# Patient Record
Sex: Male | Born: 1970 | Race: White | Hispanic: No | Marital: Single | Smoking: Never smoker
Health system: Southern US, Community
[De-identification: ages and names within clinical notes are randomized; demographics above are authoritative.]

## PROBLEM LIST (undated history)

## (undated) DIAGNOSIS — K5190 Ulcerative colitis, unspecified, without complications: Secondary | ICD-10-CM

## (undated) HISTORY — PX: CHOLECYSTECTOMY: SHX55

## (undated) HISTORY — DX: Ulcerative colitis, unspecified, without complications: K51.90

## (undated) HISTORY — PX: APPENDECTOMY: SHX54

---

## 2011-09-26 ENCOUNTER — Emergency Department (HOSPITAL_COMMUNITY): Payer: Self-pay

## 2011-09-26 ENCOUNTER — Encounter (HOSPITAL_COMMUNITY): Payer: Self-pay | Admitting: Emergency Medicine

## 2011-09-26 ENCOUNTER — Emergency Department (HOSPITAL_COMMUNITY)
Admission: EM | Admit: 2011-09-26 | Discharge: 2011-09-26 | Disposition: A | Discharge status: Home / Self Care | Payer: Self-pay | Attending: Emergency Medicine | Admitting: Emergency Medicine

## 2011-09-26 DIAGNOSIS — M545 Low back pain, unspecified: Secondary | ICD-10-CM | POA: Insufficient documentation

## 2011-09-26 DIAGNOSIS — IMO0002 Reserved for concepts with insufficient information to code with codable children: Secondary | ICD-10-CM | POA: Insufficient documentation

## 2011-09-26 MED ORDER — OXYCODONE-ACETAMINOPHEN 5-325 MG PO TABS: 2.0000 | ORAL_TABLET | ORAL | Status: DC | PRN

## 2011-09-26 MED ORDER — LORAZEPAM 2 MG/ML IJ SOLN
1.0000 mg | Freq: Once | INTRAMUSCULAR | Status: AC
  Administered 2011-09-26: 1 mg via INTRAVENOUS
  Filled 2011-09-26: qty 1

## 2011-09-26 MED ORDER — PREDNISONE 10 MG PO TABS: 20.0000 mg | ORAL_TABLET | Freq: Every day | ORAL | Status: DC

## 2011-09-26 MED ORDER — METHOCARBAMOL 750 MG PO TABS: 750.0000 mg | ORAL_TABLET | Freq: Four times a day (QID) | ORAL | Status: DC

## 2011-09-26 MED ORDER — DEXAMETHASONE SODIUM PHOSPHATE 10 MG/ML IJ SOLN
10.0000 mg | Freq: Once | INTRAMUSCULAR | Status: AC
  Administered 2011-09-26: 10 mg via INTRAVENOUS
  Filled 2011-09-26: qty 1

## 2011-09-26 MED ORDER — HYDROMORPHONE HCL PF 1 MG/ML IJ SOLN
1.0000 mg | Freq: Once | INTRAMUSCULAR | Status: AC
  Administered 2011-09-26: 1 mg via INTRAVENOUS
  Filled 2011-09-26: qty 1

## 2011-09-26 NOTE — Discharge Instructions (Signed)
Degenerative Disc Disease Degenerative disc disease is a condition caused by the changes that occur in the cushions of the backbone (spinal discs) as you grow older. Spinal discs are soft and compressible discs located between the bones of the spine (vertebrae). They act like shock absorbers. Degenerative disc disease can affect the wholespine. However, the neck and lower back are most commonly affected. Many changes can occur in the spinal discs with aging, such as:  The spinal discs may dry and shrink.   Small tears may occur in the tough, outer covering of the disc (annulus).   The disc space may become smaller due to loss of water.   Abnormal growths in the bone (spurs) may occur. This can put pressure on the nerve roots exiting the spinal canal, causing pain.   The spinal canal may become narrowed.  CAUSES  Degenerative disc disease is a condition caused by the changes that occur in the spinal discs with aging. The exact cause is not known, but there is a genetic basis for many patients. Degenerative changes can occur due to loss of fluid in the disc. This makes the disc thinner and reduces the space between the backbones. Small cracks can develop in the outer layer of the disc. This can lead to the breakdown of the disc. You are more likely to get degenerative disc disease if you are overweight. Smoking cigarettes and doing heavy work such as weightlifting can also increase your risk of this condition. Degenerative changes can start after a sudden injury. Growth of bone spurs can compress the nerve roots and cause pain.  SYMPTOMS  The symptoms vary from person to person. Some people may have no pain, while others have severe pain. The pain may be so severe that it can limit your activities. The location of the pain depends on the part of your backbone that is affected. You will have neck or arm pain if a disc in the neck area is affected. You will have pain in your back, buttocks, or legs if a  disc in the lower back is affected. The pain becomes worse while bending, reaching up, or with twisting movements. The pain may start gradually and then get worse as time passes. It may also start after a major or minor injury. You may feel numbness or tingling in the arms or legs.  DIAGNOSIS  Your caregiver will ask you about your symptoms and about activities or habits that may cause the pain. He or she may also ask about any injuries, diseases, ortreatments you have had earlier. Your caregiver will examine you to check for the range of movement that is possible in the affected area, to check for strength in your extremities, and to check for sensation in the areas of the arms and legs supplied by different nerve roots. An X-ray of the spine may be taken. Your caregiver may suggest other imaging tests, such as a computerized magnetic scan (MRI), if needed.  TREATMENT  Treatment includes rest, modifying your activities, and applying ice and heat. Your caregiver may prescribe medicines to reduce your pain and may ask you to do some exercises to strengthen your back. In some cases, you may need surgery. You and your caregiver will decide on the treatment that is best for you. HOME CARE INSTRUCTIONS   Follow proper lifting and walking techniques as advised by your caregiver.   Maintain good posture.   Exercise regularly as advised.   Perform relaxation exercises.   Change your sitting,   standing, and sleeping habits as advised. Change positions frequently.   Lose weight as advised.   Stop smoking if you smoke.   Wear supportive footwear.  SEEK MEDICAL CARE IF:  The pain does not go away within 1 to 4 weeks. SEEK IMMEDIATE MEDICAL CARE IF:   The pain is severe.   You notice weakness in your arms, hands, or legs.   You begin to lose control of your bladder or bowel.  MAKE SURE YOU:   Understand these instructions.   Will watch your condition.   Will get help right away if you are not  doing well or get worse.  Document Released: 04/02/2007 Document Revised: 05/25/2011 Document Reviewed: 04/02/2007 [employer] Patient Information 2012 [employer]. 
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
## 2011-09-26 NOTE — ED Provider Notes (Signed)
History     CSN: [id number]  Arrival date & time 09/26/11  4098   First MD Initiated Contact with Patient 09/26/11 [phone]      Chief Complaint  Patient presents with  . Back Pain    (Consider location/radiation/quality/duration/timing/severity/associated sxs/prior treatment) Patient is a 41 y.o. male presenting with back pain. The history is provided by the patient.  Back Pain    patient here with lumbar back pain that started at 5 AM. Pain starts in his lower lumbar area and radiates to his legs. No loss of bowel or bladder function. No prior history of back surgeries. Pain is described as sharp and worse with movement and made better with nothing. EMS was called and patient transported here. No recent history of cancer or trauma to the area  History reviewed. No pertinent past medical history.  History reviewed. No pertinent past surgical history.  History reviewed. No pertinent family history.  History  Substance Use Topics  . Smoking status: Not on file  . Smokeless tobacco: Not on file  . Alcohol Use: Not on file      Review of Systems  Musculoskeletal: Positive for back pain.  All other systems reviewed and are negative.    Allergies  Contrast media  Home Medications   Current Outpatient Rx  Name Route Sig Dispense Refill  . DIPHENHYDRAMINE-APAP (SLEEP) 25-500 MG PO TABS Oral Take 1 tablet by mouth at bedtime as needed. For sleep    . NAPROXEN SODIUM 220 MG PO TABS Oral Take 220 mg by mouth 2 (two) times daily as needed.      BP 117/62  Pulse 76  Temp(Src) 98.2 F (36.8 C) (Oral)  Resp 20  SpO2 100%  Physical Exam  Nursing note and vitals reviewed. Constitutional: He is oriented to person, place, and time. He appears well-developed and well-nourished.  Non-toxic appearance. No distress.  HENT:  Head: Normocephalic and atraumatic.  Eyes: Conjunctivae, EOM and lids are normal. Pupils are equal, round, and reactive to light.  Neck: Normal range of  motion. Neck supple. No tracheal deviation present. No mass present.  Cardiovascular: Normal rate, regular rhythm and normal heart sounds.  Exam reveals no gallop.   No murmur heard. Pulmonary/Chest: Effort normal and breath sounds normal. No stridor. No respiratory distress. He has no decreased breath sounds. He has no wheezes. He has no rhonchi. He has no rales.  Abdominal: Soft. Normal appearance and bowel sounds are normal. He exhibits no distension. There is no tenderness. There is no rebound and no CVA tenderness.  Musculoskeletal: Normal range of motion. He exhibits no edema and no tenderness.       Lumbar back: He exhibits tenderness, bony tenderness, pain and spasm.       Pain to palpation at lower spine  Neurological: He is alert and oriented to person, place, and time. He has normal strength. No cranial nerve deficit or sensory deficit. GCS eye subscore is 4. GCS verbal subscore is 5. GCS motor subscore is 6.  Skin: Skin is warm and dry. No abrasion and no rash noted.  Psychiatric: He has a normal mood and affect. His speech is normal and behavior is normal.    ED Course  Procedures (including critical care time)  Labs Reviewed - No data to display No results found.   No diagnosis found.    MDM  Patient given pain medication feels better. MRI results noted. Patient placed on medications and discharged home. Repeat neurological assessment at time  of discharge remained stable. No evidence of cauda equina syndrome.        Toy Baker, MD 09/26/11 1300

## 2011-09-27 ENCOUNTER — Emergency Department (HOSPITAL_COMMUNITY)
Admission: EM | Admit: 2011-09-27 | Discharge: 2011-09-27 | Disposition: A | Discharge status: Home / Self Care | Payer: Self-pay | Attending: Emergency Medicine | Admitting: Emergency Medicine

## 2011-09-27 ENCOUNTER — Encounter (HOSPITAL_COMMUNITY): Payer: Self-pay | Admitting: *Deleted

## 2011-09-27 DIAGNOSIS — F411 Generalized anxiety disorder: Secondary | ICD-10-CM | POA: Insufficient documentation

## 2011-09-27 DIAGNOSIS — M549 Dorsalgia, unspecified: Secondary | ICD-10-CM | POA: Insufficient documentation

## 2011-09-27 MED ORDER — HYDROMORPHONE HCL 2 MG PO TABS: 2.0000 mg | ORAL_TABLET | ORAL | Status: AC | PRN

## 2011-09-27 MED ORDER — DIAZEPAM 5 MG PO TABS: 5.0000 mg | ORAL_TABLET | Freq: Four times a day (QID) | ORAL | Status: AC | PRN

## 2011-09-27 MED ORDER — HYDROMORPHONE HCL PF 1 MG/ML IJ SOLN
2.0000 mg | Freq: Once | INTRAMUSCULAR | Status: AC
  Administered 2011-09-27: 2 mg via INTRAVENOUS
  Filled 2011-09-27: qty 2

## 2011-09-27 MED ORDER — LORAZEPAM 2 MG/ML IJ SOLN
1.0000 mg | Freq: Once | INTRAMUSCULAR | Status: AC
  Administered 2011-09-27: 1 mg via INTRAVENOUS
  Filled 2011-09-27: qty 1

## 2011-09-27 MED ORDER — SODIUM CHLORIDE 0.9 % IV SOLN
Freq: Once | INTRAVENOUS | Status: AC
  Administered 2011-09-27: 17:00:00 via INTRAVENOUS

## 2011-09-27 MED ORDER — DEXAMETHASONE SODIUM PHOSPHATE 10 MG/ML IJ SOLN
10.0000 mg | Freq: Once | INTRAMUSCULAR | Status: AC
  Administered 2011-09-27: 10 mg via INTRAVENOUS
  Filled 2011-09-27: qty 1

## 2011-09-27 NOTE — ED Notes (Signed)
PT was seen on Tues. For back pain . Pt was treated and D/C. Pt returns because of sever pain.  Pt pain 10/10 .Pt can not sleep . Pt also applied ice and heat to area.

## 2011-09-27 NOTE — ED Provider Notes (Signed)
History     CSN: [id number]  Arrival date & time 09/27/11  1551   First MD Initiated Contact with Patient 09/27/11 1625      Chief Complaint  Patient presents with  . Back Pain    (Consider location/radiation/quality/duration/timing/severity/associated sxs/prior treatment) The history is provided by the patient.   Patient seen by me for same yesterday complaining of severe lower back pain. He was given medications for pain and felt better at time of discharge. He also had MRI of his back which showed minimal disc disease. States that pain is severe and sharp worse with movement and not relieved by the current medications he's been prescribed. No change in bowel or bladder function. No Known injury History reviewed. No pertinent past medical history.  History reviewed. No pertinent past surgical history.  No family history on file.  History  Substance Use Topics  . Smoking status: Not on file  . Smokeless tobacco: Not on file  . Alcohol Use:       Review of Systems  All other systems reviewed and are negative.    Allergies  Contrast media  Home Medications   Current Outpatient Rx  Name Route Sig Dispense Refill  . DIPHENHYDRAMINE-APAP (SLEEP) 25-500 MG PO TABS Oral Take 1 tablet by mouth at bedtime as needed. For sleep    . METHOCARBAMOL 750 MG PO TABS Oral Take 750 mg by mouth 4 (four) times daily.    Marland Kitchen NAPROXEN SODIUM 220 MG PO TABS Oral Take 220 mg by mouth 2 (two) times daily as needed.    . OXYCODONE-ACETAMINOPHEN 5-325 MG PO TABS Oral Take 2 tablets by mouth every 4 (four) hours as needed. For pain    . PREDNISONE 10 MG PO TABS Oral Take 20 mg by mouth daily.      BP 116/73  Pulse 68  Temp(Src) 97.9 F (36.6 C) (Oral)  Resp 18  SpO2 94%  Physical Exam  Nursing note and vitals reviewed. Constitutional: He is oriented to person, place, and time. He appears well-developed and well-nourished.  Non-toxic appearance. No distress.  HENT:  Head:  Normocephalic and atraumatic.  Eyes: Conjunctivae, EOM and lids are normal. Pupils are equal, round, and reactive to light.  Neck: Normal range of motion. Neck supple. No tracheal deviation present. No mass present.  Cardiovascular: Normal rate, regular rhythm and normal heart sounds.  Exam reveals no gallop.   No murmur heard. Pulmonary/Chest: Effort normal and breath sounds normal. No stridor. No respiratory distress. He has no decreased breath sounds. He has no wheezes. He has no rhonchi. He has no rales.  Abdominal: Soft. Normal appearance and bowel sounds are normal. He exhibits no distension. There is no tenderness. There is no rebound and no CVA tenderness.  Musculoskeletal: Normal range of motion. He exhibits no edema and no tenderness.  Neurological: He is alert and oriented to person, place, and time. He has normal strength. No cranial nerve deficit or sensory deficit. GCS eye subscore is 4. GCS verbal subscore is 5. GCS motor subscore is 6.  Skin: Skin is warm and dry. No abrasion and no rash noted.  Psychiatric: His speech is normal and behavior is normal. His mood appears anxious.    ED Course  Procedures (including critical care time)  Labs Reviewed - No data to display Mr Lumbar Spine Wo Contrast  09/26/2011  *RADIOLOGY REPORT*  Clinical Data: Severe low back pain.  Numbness down the left leg.  MRI LUMBAR SPINE WITHOUT CONTRAST  Technique:  Multiplanar and multiecho pulse sequences of the lumbar spine were obtained without intravenous contrast.  Comparison: None.  Findings: Scan extends from T11-12 through S3.  Tip of the conus is at T12.  T11-12 through L2-3:  Normal.  L3-4:  Tiny far lateral disc bulge of the right with no neural impingement.  L4-5: Small disc protrusion into the right neural foramen adjacent to but not compressing the exiting right L4 nerve.  Slight bulge of the remainder of the disc touches the ventral aspect of the thecal sac and narrows both lateral recesses  slightly, right more than left.  L5-S1:  Normal.  There is no spinal stenosis or facet arthritis in the lumbar spine.  IMPRESSION:  1.  Focal small soft disc protrusion at L4-5 into the right neural foramen with a broad-based disc bulge at L4-5 which narrows the lateral recesses, right greater than left. 2.  No other significant abnormalities.  Specifically, no left sided neural impingement.  Original Report Authenticated By: Gwynn Burly, M.D.     No diagnosis found.    MDM  Patient given pain medications here and feels better. Repeat neurological exam remains stable. No concern for acute spinal cord condition. Patient's medications to be changed he'll be discharged        Toy Baker, MD 09/27/11 1859

## 2011-09-27 NOTE — ED Notes (Signed)
Patient seen one day ago in ED for lower back pain.  Discharged with pain medication and used medication as directed and ran out today.  Pain currently 10/10 sharp.  Patient need minimal assistance to transfer from wheelchair to stretcher.  Airway intact bilateral equal chest rise and fall.

## 2011-09-27 NOTE — Discharge Instructions (Signed)
Finish your prescription of prednisone. Stop taking the Robaxin and Percocet. Back Pain, Adult Low back pain is very common. About 1 in 5 people have back pain.The cause of low back pain is rarely dangerous. The pain often gets better over time.About half of people with a sudden onset of back pain feel better in just 2 weeks. About 8 in 10 people feel better by 6 weeks.  CAUSES Some common causes of back pain include:  Strain of the muscles or ligaments supporting the spine.   Wear and tear (degeneration) of the spinal discs.   Arthritis.   Direct injury to the back.  DIAGNOSIS Most of the time, the direct cause of low back pain is not known.However, back pain can be treated effectively even when the exact cause of the pain is unknown.Answering your caregiver's questions about your overall health and symptoms is one of the most accurate ways to make sure the cause of your pain is not dangerous. If your caregiver needs more information, he or she may order lab work or imaging tests (X-rays or MRIs).However, even if imaging tests show changes in your back, this usually does not require surgery. HOME CARE INSTRUCTIONS For many people, back pain returns.Since low back pain is rarely dangerous, it is often a condition that people can learn to [facility] their own.   Remain active. It is stressful on the back to sit or stand in one place. Do not sit, drive, or stand in one place for more than 30 minutes at a time. Take short walks on level surfaces as soon as pain allows.Try to increase the length of time you walk each day.   Do not stay in bed.Resting more than 1 or 2 days can delay your recovery.   Do not avoid exercise or work.Your body is made to move.It is not dangerous to be active, even though your back may hurt.Your back will likely heal faster if you return to being active before your pain is gone.   Pay attention to your body when you bend and lift. Many people have less  discomfortwhen lifting if they bend their knees, keep the load close to their bodies,and avoid twisting. Often, the most comfortable positions are those that put less stress on your recovering back.   Find a comfortable position to sleep. Use a firm mattress and lie on your side with your knees slightly bent. If you lie on your back, put a pillow under your knees.   Only take over-the-counter or prescription medicines as directed by your caregiver. Over-the-counter medicines to reduce pain and inflammation are often the most helpful.Your caregiver may prescribe muscle relaxant drugs.These medicines help dull your pain so you can more quickly return to your normal activities and healthy exercise.   Put ice on the injured area.   Put ice in a plastic bag.   Place a towel between your skin and the bag.   Leave the ice on for 15 to 20 minutes, 3 to 4 times a day for the first 2 to 3 days. After that, ice and heat may be alternated to reduce pain and spasms.   Ask your caregiver about trying back exercises and gentle massage. This may be of some benefit.   Avoid feeling anxious or stressed.Stress increases muscle tension and can worsen back pain.It is important to recognize when you are anxious or stressed and learn ways to manage it.Exercise is a great option.  SEEK MEDICAL CARE IF:  You have pain that  is not relieved with rest or medicine.   You have pain that does not improve in 1 week.   You have new symptoms.   You are generally not feeling well.  SEEK IMMEDIATE MEDICAL CARE IF:   You have pain that radiates from your back into your legs.   You develop new bowel or bladder control problems.   You have unusual weakness or numbness in your arms or legs.   You develop nausea or vomiting.   You develop abdominal pain.   You feel faint.  Document Released: 06/05/2005 Document Revised: 05/25/2011 Document Reviewed: 10/24/2010 [facility] Patient Information 2012 [place],  [place].

## 2011-09-27 NOTE — ED Notes (Signed)
Patient resting comfortably on stretcher. States pain 0/10.

## 2011-12-28 ENCOUNTER — Encounter (HOSPITAL_COMMUNITY): Payer: Self-pay | Admitting: Emergency Medicine

## 2011-12-28 ENCOUNTER — Emergency Department (HOSPITAL_COMMUNITY)
Admission: EM | Admit: 2011-12-28 | Discharge: 2011-12-28 | Disposition: A | Discharge status: Home / Self Care | Payer: Self-pay | Attending: Emergency Medicine | Admitting: Emergency Medicine

## 2011-12-28 DIAGNOSIS — H5789 Other specified disorders of eye and adnexa: Secondary | ICD-10-CM | POA: Insufficient documentation

## 2011-12-28 DIAGNOSIS — H5710 Ocular pain, unspecified eye: Secondary | ICD-10-CM | POA: Insufficient documentation

## 2011-12-28 DIAGNOSIS — H16139 Photokeratitis, unspecified eye: Secondary | ICD-10-CM | POA: Insufficient documentation

## 2011-12-28 MED ORDER — TOBRAMYCIN-DEXAMETHASONE 0.3-0.1 % OP SUSP: 1.0000 [drp] | OPHTHALMIC | Status: DC

## 2011-12-28 MED ORDER — FLUORESCEIN SODIUM 1 MG OP STRP
1.0000 | ORAL_STRIP | Freq: Once | OPHTHALMIC | Status: AC
  Administered 2011-12-28: 1 via OPHTHALMIC
  Filled 2011-12-28: qty 2

## 2011-12-28 MED ORDER — OXYCODONE-ACETAMINOPHEN 5-325 MG PO TABS: 1.0000 | ORAL_TABLET | ORAL | Status: AC | PRN

## 2011-12-28 MED ORDER — TOBRAMYCIN 0.3 % OP SOLN: 1.0000 [drp] | OPHTHALMIC | Status: AC
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
## 2013-07-22 ENCOUNTER — Emergency Department (HOSPITAL_COMMUNITY)
Admission: EM | Admit: 2013-07-22 | Discharge: 2013-07-23 | Disposition: A | Discharge status: Home / Self Care | Payer: Self-pay | Attending: Emergency Medicine | Admitting: Emergency Medicine

## 2013-07-22 ENCOUNTER — Encounter (HOSPITAL_COMMUNITY): Payer: Self-pay | Admitting: Emergency Medicine

## 2013-07-22 DIAGNOSIS — R197 Diarrhea, unspecified: Secondary | ICD-10-CM | POA: Insufficient documentation

## 2013-07-22 DIAGNOSIS — R112 Nausea with vomiting, unspecified: Secondary | ICD-10-CM | POA: Insufficient documentation

## 2013-07-22 DIAGNOSIS — Z9089 Acquired absence of other organs: Secondary | ICD-10-CM | POA: Insufficient documentation

## 2013-07-22 DIAGNOSIS — Z79899 Other long term (current) drug therapy: Secondary | ICD-10-CM | POA: Insufficient documentation

## 2013-07-22 DIAGNOSIS — R109 Unspecified abdominal pain: Secondary | ICD-10-CM | POA: Insufficient documentation

## 2013-07-22 DIAGNOSIS — K921 Melena: Secondary | ICD-10-CM | POA: Insufficient documentation

## 2013-07-22 LAB — CBC WITH DIFFERENTIAL/PLATELET
BASOS ABS: 0 10*3/uL (ref 0.0–0.1)
BASOS PCT: 0 % (ref 0–1)
EOS ABS: 0.1 10*3/uL (ref 0.0–0.7)
EOS PCT: 1 % (ref 0–5)
HEMATOCRIT: 44.7 % (ref 39.0–52.0)
HEMOGLOBIN: 15.8 g/dL (ref 13.0–17.0)
Lymphocytes Relative: 17 % (ref 12–46)
Lymphs Abs: 2.2 10*3/uL (ref 0.7–4.0)
MCH: 32 pg (ref 26.0–34.0)
MCHC: 35.3 g/dL (ref 30.0–36.0)
MCV: 90.7 fL (ref 78.0–100.0)
MONO ABS: 0.9 10*3/uL (ref 0.1–1.0)
MONOS PCT: 7 % (ref 3–12)
NEUTROS ABS: 9.7 10*3/uL — AB (ref 1.7–7.7)
Neutrophils Relative %: 75 % (ref 43–77)
Platelets: 237 10*3/uL (ref 150–400)
RBC: 4.93 MIL/uL (ref 4.22–5.81)
RDW: 13 % (ref 11.5–15.5)
WBC: 13 10*3/uL — ABNORMAL HIGH (ref 4.0–10.5)

## 2013-07-22 LAB — COMPREHENSIVE METABOLIC PANEL
ALBUMIN: 4.4 g/dL (ref 3.5–5.2)
ALT: 29 U/L (ref 0–53)
AST: 26 U/L (ref 0–37)
Alkaline Phosphatase: 81 U/L (ref 39–117)
BILIRUBIN TOTAL: 0.5 mg/dL (ref 0.3–1.2)
BUN: 13 mg/dL (ref 6–23)
CALCIUM: 9.4 mg/dL (ref 8.4–10.5)
CHLORIDE: 99 meq/L (ref 96–112)
CO2: 26 mEq/L (ref 19–32)
CREATININE: 1.01 mg/dL (ref 0.50–1.35)
GFR calc Af Amer: >90 mL/min (ref >90)
GFR calc non Af Amer: >90 mL/min (ref >90)
Glucose, Bld: 104 mg/dL — ABNORMAL HIGH (ref 70–99)
Potassium: 4 mEq/L (ref 3.7–5.3)
Sodium: 140 mEq/L (ref 137–147)
Total Protein: 7.1 g/dL (ref 6.0–8.3)

## 2013-07-22 MED ORDER — MORPHINE SULFATE 4 MG/ML IJ SOLN
4.0000 mg | Freq: Once | INTRAMUSCULAR | Status: AC
  Administered 2013-07-22: 4 mg via INTRAVENOUS
  Filled 2013-07-22: qty 1

## 2013-07-22 MED ORDER — ONDANSETRON HCL 4 MG/2ML IJ SOLN
4.0000 mg | Freq: Once | INTRAMUSCULAR | Status: AC
  Administered 2013-07-22: 4 mg via INTRAVENOUS
  Filled 2013-07-22: qty 2

## 2013-07-22 MED ORDER — ONDANSETRON HCL 4 MG/2ML IJ SOLN: 4.0000 mg | Freq: Once | INTRAMUSCULAR | Status: DC

## 2013-07-22 MED ORDER — SODIUM CHLORIDE 0.9 % IV BOLUS (SEPSIS)
1000.0000 mL | Freq: Once | INTRAVENOUS | Status: AC
  Administered 2013-07-22: 1000 mL via INTRAVENOUS

## 2013-07-22 MED ORDER — PROMETHAZINE HCL 25 MG/ML IJ SOLN
12.5000 mg | Freq: Four times a day (QID) | INTRAMUSCULAR | Status: DC | PRN
  Administered 2013-07-22: 12.5 mg via INTRAVENOUS
  Filled 2013-07-22: qty 1

## 2013-07-22 MED ORDER — FENTANYL CITRATE 0.05 MG/ML IJ SOLN
100.0000 ug | Freq: Once | INTRAMUSCULAR | Status: AC
  Administered 2013-07-22: 100 ug via INTRAVENOUS
  Filled 2013-07-22: qty 2
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
## 2013-07-22 NOTE — ED Notes (Signed)
Spoke to CT about barium based contrast for patient.

## 2013-07-22 NOTE — ED Provider Notes (Signed)
CSN: [id number]     Arrival date & time 07/22/13  1744 History   First MD Initiated Contact with Patient 07/22/13 2020     Chief Complaint  Patient presents with  . Rectal Bleeding   HPI  History provided by the patient. Patient is a 43 year old male with history of cholecystectomy, appendectomy and who reports past history of diverticulitis and multiple polyps removed from previous colonoscopies who presents with complaints worsened right lower abdominal pain. Pain is been present for the past few days. It is worse with some movements and activity. It has been associated with a few episodes of vomiting in the morning as well as loose stools for the past 3 days. He also reports sometimes feeling a bulge or pressure to the area. He is also concerned of having black dark tarry stools and possible bleeding in his colon. He states he has been diagnosed with colitis past but is unsure of what type. He reports that he has a strong family history of colon cancers in many people had to have bowel surgeries. Patient reports recently moving to [place] from [place] for work. Patient states she began having colonoscopies at age 79 and had multiple colonoscopies until he stopped following up when he was 40. Denies any associated fever, chills or sweats. Denies any urinary complaints. No dysuria, hematuria or urinary frequency.    History reviewed. No pertinent past medical history. History reviewed. No pertinent past surgical history. No family history on file. History  Substance Use Topics  . Smoking status: Never Smoker   . Smokeless tobacco: Not on file  . Alcohol Use: No    Review of Systems  Constitutional: Negative for fever, chills and diaphoresis.  Respiratory: Negative for shortness of breath.   Gastrointestinal: Positive for nausea, vomiting, abdominal pain, diarrhea and blood in stool. Negative for constipation and rectal pain.  Genitourinary: Negative for dysuria, frequency, hematuria  and flank pain.  All other systems reviewed and are negative.      Allergies  Contrast media  Home Medications   Current Outpatient Rx  Name  Route  Sig  Dispense  Refill  . Ginger, Zingiber officinalis, (GINGER ROOT PO)   Oral   Take 1 application by mouth daily.         . Multiple Vitamin (MULTIVITAMIN WITH MINERALS) TABS   Oral   Take 1 tablet by mouth daily.          BP 131/76  Pulse 78  Temp(Src) 98 F (36.7 C) (Oral)  Resp 18  Wt 221 lb (100.245 kg)  SpO2 98% Physical Exam  Nursing note and vitals reviewed. Constitutional: He is oriented to person, place, and time. He appears well-developed and well-nourished. No distress.  HENT:  Head: Normocephalic.  Cardiovascular: Normal rate and regular rhythm.   Pulmonary/Chest: Effort normal and breath sounds normal. No respiratory distress. He has no wheezes. He has no rales.  Abdominal: Soft. He exhibits no distension and no mass. There is no hepatosplenomegaly. There is tenderness. There is no rebound and no guarding. No hernia. Hernia confirmed negative in the right inguinal area and confirmed negative in the left inguinal area.    Genitourinary: Rectum normal and penis normal. Guaiac negative stool.  Neurological: He is alert and oriented to person, place, and time.  Skin: Skin is warm.  Psychiatric: He has a normal mood and affect. His behavior is normal.      ED Course  Procedures   DIAGNOSTIC STUDIES: Oxygen Saturation is  98% on room air.  COORDINATION OF CARE:  Nursing notes reviewed. Vital signs reviewed. Initial pt interview and examination performed.   8:41 PM-patient seen and evaluated. Patient appears in mild discomfort no acute distress. He is not appears ill or toxic. Discussed work up plan with pt at bedside, which includes testing and CT scan. Pt agrees with plan.  Patient feeling improved with medications. He did become nauseous while drinking by mouth contrast for CT. Additional  medicines ordered.  CT results on the back and demonstrate no signs of concerning or emergent cause of symptoms. No signs of small bowel obstruction. No hernias. No other concerning findings. I discussed the findings with patient at this time he is feeling better and ready to return home. We will provide referral for PCP and GI specialist for continued evaluation and treatment.   Treatment plan initiated: Medications  promethazine (PHENERGAN) injection 12.5 mg (12.5 mg Intravenous Given 07/22/13 2226)  ondansetron (ZOFRAN) injection 4 mg (4 mg Intravenous Given 07/22/13 1953)  fentaNYL (SUBLIMAZE) injection 100 mcg (100 mcg Intravenous Given 07/22/13 1953)  sodium chloride 0.9 % bolus 1,000 mL (0 mLs Intravenous Stopped 07/22/13 2210)  morphine 4 MG/ML injection 4 mg (4 mg Intravenous Given 07/22/13 2058)  ondansetron (ZOFRAN) injection 4 mg (4 mg Intravenous Given 07/22/13 2157)     Results for orders placed during the hospital encounter of 07/22/13  CBC WITH DIFFERENTIAL      Result Value Range   WBC 13.0 (*) 4.0 - 10.5 K/uL   RBC 4.93  4.22 - 5.81 MIL/uL   Hemoglobin 15.8  13.0 - 17.0 g/dL   HCT 16.144.7  09.639.0 - 04.552.0 %   MCV 90.7  78.0 - 100.0 fL   MCH 32.0  26.0 - 34.0 pg   MCHC 35.3  30.0 - 36.0 g/dL   RDW 40.913.0  81.111.5 - 91.415.5 %   Platelets 237  150 - 400 K/uL   Neutrophils Relative % 75  43 - 77 %   Neutro Abs 9.7 (*) 1.7 - 7.7 K/uL   Lymphocytes Relative 17  12 - 46 %   Lymphs Abs 2.2  0.7 - 4.0 K/uL   Monocytes Relative 7  3 - 12 %   Monocytes Absolute 0.9  0.1 - 1.0 K/uL   Eosinophils Relative 1  0 - 5 %   Eosinophils Absolute 0.1  0.0 - 0.7 K/uL   Basophils Relative 0  0 - 1 %   Basophils Absolute 0.0  0.0 - 0.1 K/uL  COMPREHENSIVE METABOLIC PANEL      Result Value Range   Sodium 140  137 - 147 mEq/L   Potassium 4.0  3.7 - 5.3 mEq/L   Chloride 99  96 - 112 mEq/L   CO2 26  19 - 32 mEq/L   Glucose, Bld 104 (*) 70 - 99 mg/dL   BUN 13  6 - 23 mg/dL   Creatinine, Ser 7.821.01  0.50 - 1.35  mg/dL   Calcium 9.4  8.4 - 95.610.5 mg/dL   Total Protein 7.1  6.0 - 8.3 g/dL   Albumin 4.4  3.5 - 5.2 g/dL   AST 26  0 - 37 U/L   ALT 29  0 - 53 U/L   Alkaline Phosphatase 81  39 - 117 U/L   Total Bilirubin 0.5  0.3 - 1.2 mg/dL   GFR calc non Af Amer >90  >90 mL/min   GFR calc Af Amer >90  >90 mL/min  URINALYSIS, ROUTINE  W REFLEX MICROSCOPIC      Result Value Range   Color, Urine YELLOW  YELLOW   APPearance CLEAR  CLEAR   Specific Gravity, Urine 1.009  1.005 - 1.030   pH 6.5  5.0 - 8.0   Glucose, UA NEGATIVE  NEGATIVE mg/dL   Hgb urine dipstick NEGATIVE  NEGATIVE   Bilirubin Urine NEGATIVE  NEGATIVE   Ketones, ur NEGATIVE  NEGATIVE mg/dL   Protein, ur NEGATIVE  NEGATIVE mg/dL   Urobilinogen, UA 0.2  0.0 - 1.0 mg/dL   Nitrite NEGATIVE  NEGATIVE   Leukocytes, UA NEGATIVE  NEGATIVE     Imaging Review Ct Abdomen Pelvis Wo Contrast  07/23/2013   CLINICAL DATA:  Right lower quadrant pain. Nausea, vomiting and diarrhea.  EXAM: CT ABDOMEN AND PELVIS WITHOUT CONTRAST  TECHNIQUE: Multidetector CT imaging of the abdomen and pelvis was performed following the standard protocol without intravenous contrast.  COMPARISON:  None.  FINDINGS: The lung bases are clear.  No pleural or pericardial effusion.  There are no renal or ureteral stones. Kidneys appear normal bilaterally. The gallbladder and appendix have been removed. The liver, spleen, adrenal glands, pancreas and biliary tree are unremarkable. Urinary bladder, prostate gland and seminal vesicles appear normal. Minimal sigmoid diverticulosis is noted. The stomach and small bowel appear normal. There is no lymphadenopathy or fluid. No focal bony abnormality.  IMPRESSION: Negative for urinary tract stone.  No acute finding.  Minimal sigmoid diverticulosis without diverticulitis.   Electronically Signed   By: Drusilla Kanner M.D.   On: 07/23/2013 00:40      MDM   1. Abdominal pain   2. Nausea & vomiting        Angus Seller,  PA-C 07/23/13 [phone]

## 2013-07-22 NOTE — ED Notes (Addendum)
Pt received barium.

## 2013-07-22 NOTE — ED Notes (Addendum)
Pt reports bright red rectal bleeding since lunch time today. Pt reports pain to lower abdomen right side in groin. No nausea/vomiting. Large family history of colon cancer.

## 2013-07-22 NOTE — ED Notes (Signed)
Pt reports having black and watery and tarry stools x1 month and states "the pain became unbearable today" pt reports pain is in left groin and states "I know there's a lump in there". Pt states he has not had a solid BM in approx 4 days.

## 2013-07-23 ENCOUNTER — Emergency Department (HOSPITAL_COMMUNITY): Payer: Self-pay

## 2013-07-23 ENCOUNTER — Encounter (HOSPITAL_COMMUNITY): Payer: Self-pay | Admitting: Radiology

## 2013-07-23 LAB — URINALYSIS, ROUTINE W REFLEX MICROSCOPIC
Bilirubin Urine: NEGATIVE
GLUCOSE, UA: NEGATIVE mg/dL
Hgb urine dipstick: NEGATIVE
Ketones, ur: NEGATIVE mg/dL
LEUKOCYTES UA: NEGATIVE
Nitrite: NEGATIVE
PROTEIN: NEGATIVE mg/dL
SPECIFIC GRAVITY, URINE: 1.009 (ref 1.005–1.030)
UROBILINOGEN UA: 0.2 mg/dL (ref 0.0–1.0)
pH: 6.5 (ref 5.0–8.0)

## 2013-07-23 LAB — OCCULT BLOOD, POC DEVICE: FECAL OCCULT BLD: NEGATIVE

## 2013-07-23 MED ORDER — HYDROCODONE-ACETAMINOPHEN 5-325 MG PO TABS: 1.0000 | ORAL_TABLET | ORAL | Status: DC | PRN

## 2013-07-23 MED ORDER — PROMETHAZINE HCL 25 MG PO TABS: 25.0000 mg | ORAL_TABLET | Freq: Four times a day (QID) | ORAL | Status: DC | PRN

## 2013-07-23 NOTE — Discharge Instructions (Signed)
Your Lab testing and CT scan of your abdomen has not shown any signs for a concerning or emergent condition to explain your symptoms today.  Please continue to follow up with a primary care provider and GI specialist for continued evaluation and treatment.  Return for any changing or worsening symptoms.    Abdominal Pain, Adult Many things can cause abdominal pain. Usually, abdominal pain is not caused by a disease and will improve without treatment. It can often be observed and treated at home. Your health care provider will do a physical exam and possibly order blood tests and X-rays to help determine the seriousness of your pain. However, in many cases, more time must pass before a clear cause of the pain can be found. Before that point, your health care provider may not know if you need more testing or further treatment. HOME CARE INSTRUCTIONS  Monitor your abdominal pain for any changes. The following actions may help to alleviate any discomfort you are experiencing:  Only take over-the-counter or prescription medicines as directed by your health care provider.  Do not take laxatives unless directed to do so by your health care provider.  Try a clear liquid diet (broth, tea, or water) as directed by your health care provider. Slowly move to a bland diet as tolerated. SEEK MEDICAL CARE IF:  You have unexplained abdominal pain.  You have abdominal pain associated with nausea or diarrhea.  You have pain when you urinate or have a bowel movement.  You experience abdominal pain that wakes you in the night.  You have abdominal pain that is worsened or improved by eating food.  You have abdominal pain that is worsened with eating fatty foods. SEEK IMMEDIATE MEDICAL CARE IF:   Your pain does not go away within 2 hours.  You have a fever.  You keep throwing up (vomiting).  Your pain is felt only in portions of the abdomen, such as the right side or the left lower portion of the  abdomen.  You pass bloody or black tarry stools. MAKE SURE YOU:  Understand these instructions.   Will watch your condition.   Will get help right away if you are not doing well or get worse.  Document Released: 03/15/2005 Document Revised: 03/26/2013 Document Reviewed: 02/12/2013 [facility] Patient Information 2014 [employer], [place].

## 2013-07-23 NOTE — ED Notes (Signed)
Patient transported to CT 

## 2013-07-23 NOTE — ED Notes (Signed)
Patient transported back from CT 

## 2013-07-24 NOTE — ED Provider Notes (Signed)
Medical screening examination/treatment/procedure(s) were performed by non-physician practitioner and as supervising physician I was immediately available for consultation/collaboration.  EKG Interpretation   None         Lyanne CoKevin M Finnleigh Marchetti, MD 07/24/13 [phone]

## 2013-09-11 ENCOUNTER — Ambulatory Visit: Payer: Self-pay

## 2013-09-12 ENCOUNTER — Encounter: Payer: Self-pay | Admitting: Internal Medicine

## 2013-09-12 ENCOUNTER — Ambulatory Visit: Payer: Self-pay | Attending: Internal Medicine | Admitting: Internal Medicine

## 2013-09-12 VITALS — BP 117/76 | HR 67 | Temp 98.6°F | Resp 16 | Ht 71.0 in | Wt 219.0 lb

## 2013-09-12 DIAGNOSIS — K5190 Ulcerative colitis, unspecified, without complications: Secondary | ICD-10-CM | POA: Insufficient documentation

## 2013-09-12 DIAGNOSIS — Z0000 Encounter for general adult medical examination without abnormal findings: Secondary | ICD-10-CM

## 2013-09-12 LAB — CBC WITH DIFFERENTIAL/PLATELET
Basophils Absolute: 0 10*3/uL (ref 0.0–0.1)
Basophils Relative: 0 % (ref 0–1)
EOS ABS: 0.1 10*3/uL (ref 0.0–0.7)
Eosinophils Relative: 1 % (ref 0–5)
HCT: 45.9 % (ref 39.0–52.0)
Hemoglobin: 16 g/dL (ref 13.0–17.0)
Lymphocytes Relative: 20 % (ref 12–46)
Lymphs Abs: 1.3 10*3/uL (ref 0.7–4.0)
MCH: 30.7 pg (ref 26.0–34.0)
MCHC: 34.9 g/dL (ref 30.0–36.0)
MCV: 88.1 fL (ref 78.0–100.0)
Monocytes Absolute: 0.5 10*3/uL (ref 0.1–1.0)
Monocytes Relative: 8 % (ref 3–12)
NEUTROS ABS: 4.7 10*3/uL (ref 1.7–7.7)
Neutrophils Relative %: 71 % (ref 43–77)
Platelets: 236 10*3/uL (ref 150–400)
RBC: 5.21 MIL/uL (ref 4.22–5.81)
RDW: 13.3 % (ref 11.5–15.5)
WBC: 6.6 10*3/uL (ref 4.0–10.5)

## 2013-09-12 LAB — COMPLETE METABOLIC PANEL WITH GFR
ALBUMIN: 4.3 g/dL (ref 3.5–5.2)
ALT: 19 U/L (ref 0–53)
AST: 18 U/L (ref 0–37)
Alkaline Phosphatase: 72 U/L (ref 39–117)
BUN: 12 mg/dL (ref 6–23)
CALCIUM: 9.4 mg/dL (ref 8.4–10.5)
CHLORIDE: 102 meq/L (ref 96–112)
CO2: 27 mEq/L (ref 19–32)
Creat: 0.8 mg/dL (ref 0.50–1.35)
GLUCOSE: 90 mg/dL (ref 70–99)
POTASSIUM: 5.2 meq/L (ref 3.5–5.3)
SODIUM: 140 meq/L (ref 135–145)
TOTAL PROTEIN: 6.3 g/dL (ref 6.0–8.3)
Total Bilirubin: 0.8 mg/dL (ref 0.2–1.2)

## 2013-09-12 LAB — LIPID PANEL
Cholesterol: 213 mg/dL — ABNORMAL HIGH (ref 0–200)
HDL: 38 mg/dL — ABNORMAL LOW (ref >39)
LDL CALC: 166 mg/dL — AB (ref 0–99)
TRIGLYCERIDES: 47 mg/dL (ref <150)
Total CHOL/HDL Ratio: 5.6 Ratio
VLDL: 9 mg/dL (ref 0–40)

## 2013-09-12 LAB — POCT GLYCOSYLATED HEMOGLOBIN (HGB A1C): Hemoglobin A1C: 5

## 2013-09-12 NOTE — Progress Notes (Signed)
Patient ID: Walter DinningRobert Kiernan, male   DOB: [birth year], 43 y.o.   MRN: [id number]  CC: New patient  HPI: 43 year old male with past medical history significant for ulcerative colitis, per patient last colonoscopy about 9 years ago with few polyps removal. [place] and he needs to establish primary care provider. At this time patient has no complaints of abdominal pain, nausea or vomiting. No complaints of bleeding per rectum. No diarrhea or constipation.  Allergies  Allergen Reactions  . Contrast Media [Iodinated Diagnostic Agents] Other (See Comments)    Body shuts down   Past Medical History  Diagnosis Date  . Ulcerative colitis    Current Outpatient Prescriptions on File Prior to Visit  Medication Sig Dispense Refill  . Ginger, Zingiber officinalis, (GINGER ROOT PO) Take 1 application by mouth daily.      Marland Kitchen. HYDROcodone-acetaminophen (NORCO/VICODIN) 5-325 MG per tablet Take 1 tablet by mouth every 4 (four) hours as needed for moderate pain.  10 tablet  0  . Multiple Vitamin (MULTIVITAMIN WITH MINERALS) TABS Take 1 tablet by mouth daily.      . promethazine (PHENERGAN) 25 MG tablet Take 1 tablet (25 mg total) by mouth every 6 (six) hours as needed for nausea.  20 tablet  0   No current facility-administered medications on file prior to visit.   Family History  Problem Relation Age of Onset  . Ulcerative colitis Mother   . Heart disease Father    History   Social History  . Marital Status: Single    Spouse Name: N/A    Number of Children: N/A  . Years of Education: N/A   Occupational History  . Not on file.   Social History Main Topics  . Smoking status: Never Smoker   . Smokeless tobacco: Not on file  . Alcohol Use: No  . Drug Use: No  . Sexual Activity: Not on file   Other Topics Concern  . Not on file   Social History Narrative  . No narrative on file    Review of Systems  Constitutional: Negative for fever, chills,  diaphoresis, activity change, appetite change and fatigue.  HENT: Negative for ear pain, nosebleeds, congestion, facial swelling, rhinorrhea, neck pain, neck stiffness and ear discharge.   Eyes: Negative for pain, discharge, redness, itching and visual disturbance.  Respiratory: Negative for cough, choking, chest tightness, shortness of breath, wheezing and stridor.   Cardiovascular: Negative for chest pain, palpitations and leg swelling.  Gastrointestinal: Negative for abdominal distention.  Genitourinary: Negative for dysuria, urgency, frequency, hematuria, flank pain, decreased urine volume, difficulty urinating and dyspareunia.  Musculoskeletal: Negative for back pain, joint swelling, arthralgias and gait problem.  Neurological: Negative for dizziness, tremors, seizures, syncope, facial asymmetry, speech difficulty, weakness, light-headedness, numbness and headaches.  Hematological: Negative for adenopathy. Does not bruise/bleed easily.  Psychiatric/Behavioral: Negative for hallucinations, behavioral problems, confusion, dysphoric mood, decreased concentration and agitation.    Objective:   Filed Vitals:   09/12/13 0929  BP: 117/76  Pulse: 67  Temp: 98.6 F (37 C)  Resp: 16    Physical Exam  Constitutional: Appears well-developed and well-nourished. No distress.  HENT: Normocephalic. External right and left ear normal. Oropharynx is clear and moist.  Eyes: Conjunctivae and EOM are normal. PERRLA, no scleral icterus.  Neck: Normal ROM. Neck supple. No JVD. No tracheal deviation. No thyromegaly.  CVS: RRR, S1/S2 +, no murmurs, no gallops, no carotid bruit.  Pulmonary: Effort and breath sounds normal,  no stridor, rhonchi, wheezes, rales.  Abdominal: Soft. BS +,  no distension, tenderness, rebound or guarding.  Musculoskeletal: Normal range of motion. No edema and no tenderness.  Lymphadenopathy: No lymphadenopathy noted, cervical, inguinal. Neuro: Alert. Normal reflexes, muscle  tone coordination. No cranial nerve deficit. Skin: Skin is warm and dry. No rash noted. Not diaphoretic. No erythema. No pallor.  Psychiatric: Normal mood and affect. Behavior, judgment, thought content normal.   Lab Results  Component Value Date   WBC 13.0* 07/22/2013   HGB 15.8 07/22/2013   HCT 44.7 07/22/2013   MCV 90.7 07/22/2013   PLT 237 07/22/2013   Lab Results  Component Value Date   CREATININE 1.01 07/22/2013   BUN 13 07/22/2013   NA 140 07/22/2013   K 4.0 07/22/2013   CL 99 07/22/2013   CO2 26 07/22/2013    No results found for this basename: HGBA1C   Lipid Panel  No results found for this basename: chol, trig, hdl, cholhdl, vldl, ldlcalc       Assessment and plan:   Patient Active Problem List   Diagnosis Date Noted  . Ulcerative colitis, unspecified 09/12/2013    Priority: High - GI referral provided. We will also obtain blood work, CBC, CMP, lipid panel, TSH, A1c.

## 2013-09-12 NOTE — Patient Instructions (Signed)
Ulcerative Colitis Ulcerative colitis is a long lasting swelling and soreness (inflammation) of the colon (large intestine). In patients with ulcerative colitis, sores (ulcers) and inflammation of the inner lining of the colon lead to illness. Ulcerative colitis can also cause problems outside the digestive tract.  Ulcerative colitis is closely related to another condition of inflammation of the intestines called Crohn's disease. Together, they are frequently referred to as inflammatory bowel disease (IBD). Ulcerative colitis and Crohn's diseases are conditions that can last years to decades. Men and women are affected equally. They most commonly begin during adolescence and early adulthood. SYMPTOMS  Common symptoms of ulcerative colitis include rectal bleeding and diarrhea. There is a wide range of symptoms among patients with this disease depending on how severe the disease is. Some of these symptoms are:  Abdominal pain or cramping.  Diarrhea.  Fever.  Tiredness (fatigue).  Weight loss.  Night sweats.  Rectal pain.  Feeling the immediate need to have a bowel movement (rectal urgency). CAUSES  Ulcerative colitis is caused by increased activity of the immune system in the intestines. The immune system is the system that protects the body against disease such as harmful bacteria, viruses, fungi, and other foreign invaders. When the immune system overacts, it causes inflammation. The cause of the increased immune system activity is not known. This over activity causes long-lasting inflammation and ulceration. This condition may be passed down from your parents (inherited). Brothers, sisters, children, and parents of patients with IBD are more likely to develop these diseases. It is not contagious. This means you cannot catch it from someone else. DIAGNOSIS  Your caregiver may suspect ulcerative colitis based on your symptoms and exam. Blood tests may confirm that there is a problem. You may  be asked to submit a stool specimen for examination. X-rays and CT scans may be necessary. Ultimately, the diagnosis is usually made after a flexible tube is inserted via your anus and your colon is examined under sedation (colonoscopy). With this test, the specialist can take a tiny tissue sample from inside the bowel (biopsy). Examination of this biopsy tissue under a microscopy can reveal ulcerative colitis as the cause of your symptoms. TREATMENT   There is no cure for ulcerative colitis.  Complications such as massive bleeding from the colon (hemorrhage), development of a hole in the colon (perforation), or the development of precancerous or cancerous changes of the colon may require surgery.  Medications are often used to decrease inflammation and control the immune system. These include medicines related to aspirin, steroid medications, and newer and stronger medications to slow down the immune system. Some medications may be used as suppositories or enemas. A number of other medications are used or have been studied. Your caregiver will make specific recommendations. HOME CARE INSTRUCTIONS   There is no cure for ulcerative colitis disease. The best treatment is frequent checkups with your caregiver. Periodic reevaluation is important.  Symptoms such as diarrhea can be controlled with medications. Avoid foods that have a laxative effect such fresh fruit and vegetables and dairy products. During flare ups, you can rest your bowel by staying away from solid foods. Drink clear liquids frequently during the day. Electrolyte or rehydrating fluids are best. Your caregiver can help you with suggestions. Drink often to prevent dehydration. When diarrhea has cleared, eat smaller meals and more often. Avoid food additives and stimulants such as caffeine (coffee, tea, many sodas, or chocolate). Avoid dairy products. Enzyme supplements may help if you develop intolerance to   a sugar in dairy products  (lactose). Ask your caregiver or dietitian about specific dietary instructions.  If you had surgery, be sure you understand your care instructions thoroughly, including proper care of any surgical wounds.  Take any medications exactly as prescribed.  Try to maintain a positive attitude. Learn relaxation techniques such as self hypnosis, mental imaging, and muscle relaxation. If possible, avoid stresses that aggravate your condition. Exercise regularly. Follow your diet. Always get plenty of rest. SEEK MEDICAL CARE IF:   Your symptoms fail to improve after a week or two of new treatment.  You experience continued weight loss.  You have ongoing crampy digestion or loose bowels.  You develop a new skin rash, skin sores, or eye problems. SEEK IMMEDIATE MEDICAL CARE IF:   You have worsening of your symptoms or develop new symptoms.  You have an oral temperature above 102 F (38.9 C), not controlled by medicine.  You develop bloody diarrhea.  You have severe abdominal pain. Document Released: 03/15/2005 Document Revised: 08/28/2011 Document Reviewed: 02/12/2007 Exit[employer] Patient Information 2014 [employer].  

## 2013-09-12 NOTE — Progress Notes (Signed)
Pt here to establish care for ulcerative colitis  Last seen in [facility] 2 weeks ago for flare up Not taking prescribed medications Denies pain at this time VSS. Request flu vaccine

## 2013-09-13 LAB — TSH: TSH: 0.856 u[IU]/mL (ref 0.350–4.500)

## 2013-10-13 ENCOUNTER — Encounter: Payer: Self-pay | Admitting: Internal Medicine

## 2013-12-12 ENCOUNTER — Ambulatory Visit: Payer: Self-pay | Admitting: Internal Medicine

## 2014-01-13 ENCOUNTER — Emergency Department (INDEPENDENT_AMBULATORY_CARE_PROVIDER_SITE_OTHER)
Admission: EM | Admit: 2014-01-13 | Discharge: 2014-01-13 | Disposition: A | Discharge status: Home / Self Care | Payer: 59 | Source: Home / Self Care | Attending: Family Medicine | Admitting: Family Medicine

## 2014-01-13 ENCOUNTER — Encounter (HOSPITAL_COMMUNITY): Payer: Self-pay | Admitting: Emergency Medicine

## 2014-01-13 ENCOUNTER — Emergency Department (INDEPENDENT_AMBULATORY_CARE_PROVIDER_SITE_OTHER): Payer: 59

## 2014-01-13 DIAGNOSIS — M24419 Recurrent dislocation, unspecified shoulder: Secondary | ICD-10-CM

## 2014-01-13 DIAGNOSIS — M24411 Recurrent dislocation, right shoulder: Secondary | ICD-10-CM

## 2014-01-13 DIAGNOSIS — M25519 Pain in unspecified shoulder: Secondary | ICD-10-CM

## 2014-01-13 DIAGNOSIS — M25511 Pain in right shoulder: Secondary | ICD-10-CM

## 2014-01-13 MED ORDER — TRAMADOL HCL 50 MG PO TABS: 50.0000 mg | ORAL_TABLET | Freq: Four times a day (QID) | ORAL | Status: DC | PRN

## 2014-01-13 NOTE — ED Notes (Signed)
C/o right shoulder pain due to trying to lift a pool table  States shoulder does pop in and out of place Fingers go numb when shoulder is popped out of place

## 2014-01-13 NOTE — ED Provider Notes (Signed)
CSN: [id number]     Arrival date & time 01/13/14  1221 History   First MD Initiated Contact with Patient 01/13/14 1316     Chief Complaint  Patient presents with  . Shoulder Pain   (Consider location/radiation/quality/duration/timing/severity/associated sxs/prior Treatment) HPI Comments: 43 year old male presents complaining of right shoulder pain. 3 weeks ago, he was lifting a pool table when he felt his right shoulder go out of place. He had somebody help him to move around and he felt like it went back into place. It was very sore but he thought he would be okay. A few days later with doing something less mild this happen again, and this time his right thumb and index finger went numb. Again he move the arm it went back into place, and the numbness resolved. This has continued to happen 3 or 4 more times over the past couple of weeks. The shoulder was extremely sore and he has a limited range of motion. He has no numbness at this time he feels like the shoulder is not out of place. 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 This is the first time his right shoulder has never dislocated, although he does have a remote history of left shoulder dislocation. Currently he has soreness and decreased range of motion, denies any other symptoms. Relieved somewhat by rest and exacerbated by any movement. The shoulder went out of place most recently this morning while he was attempting to make breakfast.  Patient is a 43 y.o. male presenting with shoulder pain.  Shoulder Pain    Past Medical History  Diagnosis Date  . Ulcerative colitis    Past Surgical History  Procedure Laterality Date  . Cholecystectomy    . Appendectomy     Family History  Problem Relation Age of Onset  . Ulcerative colitis Mother   . Heart disease Father    History  Substance Use Topics  . Smoking status: Never Smoker   .  Smokeless tobacco: Not on file  . Alcohol Use: No    Review of Systems  Musculoskeletal: Positive for arthralgias (see HPI). Negative for joint swelling.  Neurological: Positive for numbness.  All other systems reviewed and are negative.   Allergies  Contrast media  Home Medications   Prior to Admission medications   Medication Sig Start Date End Date Taking? Authorizing Provider  Ginger, Zingiber officinalis, (GINGER ROOT PO) Take 1 application by mouth daily.    Historical Provider, MD  HYDROcodone-acetaminophen (NORCO/VICODIN) 5-325 MG per tablet Take 1 tablet by mouth every 4 (four) hours as needed for moderate pain. 07/23/13   Angus Seller, PA-C  Multiple Vitamin (MULTIVITAMIN WITH MINERALS) TABS Take 1 tablet by mouth daily.    Historical Provider, MD  promethazine (PHENERGAN) 25 MG tablet Take 1 tablet (25 mg total) by mouth every 6 (six) hours as needed for nausea. 07/23/13   Phill Mutter Dammen, PA-C  traMADol (ULTRAM) 50 MG tablet Take 1 tablet (50 mg total) by mouth every 6 (six) hours as needed. 01/13/14   Adrian Blackwater Adrianah Prophete, PA-C   BP 148/91  Pulse 64  Temp(Src) 98.5 F (36.9 C) (Oral)  Resp 18  SpO2 98% Physical Exam  Nursing note and vitals reviewed. Constitutional: He is oriented to person, place, and time. He appears well-developed and well-nourished. No distress.  HENT:  Head: Normocephalic.  Cardiovascular:  Pulses:      Radial pulses are 2+ on the right side, and 2+ on the left side.  Pulmonary/Chest: Effort normal. No respiratory distress.  Musculoskeletal:       Right shoulder: He exhibits decreased range of motion (severely limited with internal rotation, mild limitation with other ROM ), tenderness (lateral joint line ) and pain. He exhibits no bony tenderness, no swelling, no effusion, no crepitus, no deformity, no spasm and normal strength.  Neurological: He is alert and oriented to person, place, and time. He has normal strength. No sensory deficit.  Coordination normal.  Skin: Skin is warm and dry. No rash noted. He is not diaphoretic.  Psychiatric: He has a normal mood and affect. Judgment normal.    ED Course  Procedures (including critical care time) Labs Review Labs Reviewed - No data to display  Imaging Review Dg Shoulder Right  01/13/2014   CLINICAL DATA:  Pain.  EXAM: RIGHT SHOULDER - 2+ VIEW  COMPARISON:  None.  FINDINGS: No acute bony or joint abnormality identified. No evidence of fracture dislocation.  IMPRESSION: No acute abnormality.   Electronically Signed   By: Maisie Fus  Register   On: 01/13/2014 13:54     MDM   1. Right shoulder pain   2. Recurrent shoulder dislocation, right    He has enough range of motion to where I don't think he is dislocated at this time, and the x-ray supports this. Shoulder immobilized, discharge with tramadol, avoid NSAIDs due to history of ulcerative colitis. Followup with orthopedics   Meds ordered this encounter  Medications  . traMADol (ULTRAM) 50 MG tablet    Sig: Take 1 tablet (50 mg total) by mouth every 6 (six) hours as needed.    Dispense:  30 tablet    Refill:  0    Order Specific Question:  Supervising Provider    Answer:  Bradd Canary D [[id number]]      Graylon Good, PA-C 01/14/14 [phone]

## 2014-01-13 NOTE — Discharge Instructions (Signed)
Dislocation or Subluxation °Dislocation of a joint occurs when ends of two or more adjacent bones no longer touch each other. A subluxation is a minor form of a dislocation, in which two or more adjacent bones are no longer properly aligned. The most common joints susceptible to a dislocation are the shoulder, kneecap, and fingers.  °SYMPTOMS  °· Sudden pain at the time of injury. °· Noticeable deformity in the area of the joint. °· Limited range of motion. °CAUSES  °· Usually a traumatic injury that stretches or tears ligaments that surround a joint and hold the bones together. °· Condition present at birth (congenital) in which the joint surfaces are shallow or abnormally formed. °· Joint disease such as arthritis or other diseases of ligaments and tissues around a joint. °RISK INCREASES WITH: °· Repeated injury to a joint. °· Previous dislocation of a joint. °· Contact sports (football, rugby, hockey, lacrosse) or sports that require repetitive overhead arm motion (throwing, swimming, volleyball). °· Rheumatoid arthritis. °· Congenital joint condition. °PREVENTION °· Warm up and stretch properly before activity. °· Maintain physical fitness: °¨ Joint flexibility. °¨ Muscle strength and endurance. °¨ Cardiovascular fitness. °· Wear proper protective equipment and ensure correct fit. °· Learn and use proper technique. °PROGNOSIS  °This condition is usually curable with prompt treatment. After the dislocation has been put back in place, the joint may require immobilization with a cast, splint, or sling for 2 to 6 weeks, often followed by strength and stretching exercises that may be performed at home or with a therapist. °RELATED COMPLICATIONS  °· Damage to nearby nerves or major blood vessels, causing numbness, coldness, or paleness. °· Recurrent injury to the joint. °· Arthritis of affected joint. °· Fracture of joint. °TREATMENT °Treatment initially involves realigning the bones (reduction) of the joint.  Reductions should only be performed by someone who is trained in the procedure. After the joint is reduced, medicine and ice should be used to reduce pain and inflammation. The joint may be immobilized to allow for the muscles and ligaments to heal. If a joint is subjected to recurrent dislocations, surgery may be necessary to tighten or replace injured structures. After surgery, stretching and strengthening exercises may be required. These may be performed at home or with a therapist. °MEDICATION  °· Patients may require medicine to help them relax (sedative) or muscle relaxants in order to reduce the joint. °· If pain medicine is necessary, nonsteroidal anti-inflammatory medicines, such as aspirin and ibuprofen, or other minor pain relievers, such as acetaminophen, are often recommended. °· Do not take pain medicine for 7 days before surgery. °· Prescription pain relievers may be necessary. Use only as directed and only as much as you need. °SEEK MEDICAL CARE IF:  °· Symptoms get worse or do not improve despite treatment. °· You have difficulty moving a joint after injury. °· Any extremity becomes numb, pale, or cool after injury. This is an emergency. °· Dislocations or subluxations occur repeatedly. °Document Released: 06/05/2005 Document Revised: 08/28/2011 Document Reviewed: 09/17/2008 °ExitCare® Patient Information ©2015 [employer]. This information is not intended to replace advice given to you by your health care provider. Make sure you discuss any questions you have with your health care provider. ° °

## 2014-01-14 NOTE — ED Provider Notes (Signed)
Medical screening examination/treatment/procedure(s) were performed by resident physician or non-physician practitioner and as supervising physician I was immediately available for consultation/collaboration.   Tynetta Bachmann DOUGLAS MD.   Oreatha Fabry D Keymon Mcelroy, MD 01/14/14 2016 

## 2014-03-24 ENCOUNTER — Encounter (HOSPITAL_COMMUNITY): Payer: Self-pay | Admitting: Emergency Medicine

## 2014-03-24 ENCOUNTER — Emergency Department (HOSPITAL_COMMUNITY)
Admission: EM | Admit: 2014-03-24 | Discharge: 2014-03-24 | Disposition: A | Discharge status: Home / Self Care | Payer: 59 | Attending: Emergency Medicine | Admitting: Emergency Medicine

## 2014-03-24 DIAGNOSIS — Z79899 Other long term (current) drug therapy: Secondary | ICD-10-CM | POA: Insufficient documentation

## 2014-03-24 DIAGNOSIS — R21 Rash and other nonspecific skin eruption: Secondary | ICD-10-CM | POA: Diagnosis present

## 2014-03-24 DIAGNOSIS — Z8719 Personal history of other diseases of the digestive system: Secondary | ICD-10-CM | POA: Diagnosis not present

## 2014-03-24 MED ORDER — CEPHALEXIN 500 MG PO CAPS: 500.0000 mg | ORAL_CAPSULE | Freq: Four times a day (QID) | ORAL | Status: AC

## 2014-03-24 MED ORDER — SULFAMETHOXAZOLE-TRIMETHOPRIM 800-160 MG PO TABS: 1.0000 | ORAL_TABLET | Freq: Two times a day (BID) | ORAL | Status: AC

## 2014-03-24 MED ORDER — HYDROXYZINE HCL 10 MG PO TABS: 10.0000 mg | ORAL_TABLET | Freq: Four times a day (QID) | ORAL | Status: AC | PRN

## 2014-03-24 MED ORDER — PERMETHRIN 5 % EX CREA: TOPICAL_CREAM | CUTANEOUS | Status: AC

## 2014-03-24 MED ORDER — CEPHALEXIN 250 MG PO CAPS: 500.0000 mg | ORAL_CAPSULE | Freq: Once | ORAL | Status: DC

## 2014-03-24 NOTE — ED Provider Notes (Signed)
CSN: [id number]     Arrival date & time 03/24/14  2107 History  This chart was scribed for Sharilyn Sites, PA-C working with Samuel Jester, DO by Evon Slack, ED Scribe. This patient was seen in room TR08C/TR08C and the patient's care was started at 9:35 PM.    Chief Complaint  Patient presents with  . Rash   Patient is a 43 y.o. male presenting with rash. The history is provided by the patient. No language interpreter was used.  Rash Associated symptoms: no fever and no shortness of breath    HPI Comments: Walter Cochran is a 43 y.o. male who presents to the Emergency Department complaining of generalized rash onset 3-4 weeks. He describes the rash as itchy and red.  He state that he initially thought it was chiggers. He states he tried scrubbing the rash and cleaning with Clorox with temporary relief for about 5 days. He states that this is the third time the rash has appeared. He states that he scrubbed the rash on his right leg with a brillo pad with no relief.  Denies using any new soaps or detergents. No new medications.  Denies fever, red streaking, oral lesions, or trouble breathing. No fever, chills.  No known tick bites or plant exposure.  No one at home with similar rash.  No fever, chills, sweats.  VS stable on arrival.  Past Medical History  Diagnosis Date  . Ulcerative colitis    Past Surgical History  Procedure Laterality Date  . Cholecystectomy    . Appendectomy     Family History  Problem Relation Age of Onset  . Ulcerative colitis Mother   . Heart disease Father    History  Substance Use Topics  . Smoking status: Never Smoker   . Smokeless tobacco: Not on file  . Alcohol Use: No    Review of Systems  Constitutional: Negative for fever.  Respiratory: Negative for shortness of breath.   Skin: Positive for color change and rash.  All other systems reviewed and are negative.   Allergies  Contrast media  Home Medications   Prior to Admission medications    Medication Sig Start Date End Date Taking? Authorizing Provider  Ginger, Zingiber officinalis, (GINGER ROOT PO) Take 1 application by mouth daily.    Historical Provider, MD  HYDROcodone-acetaminophen (NORCO/VICODIN) 5-325 MG per tablet Take 1 tablet by mouth every 4 (four) hours as needed for moderate pain. 07/23/13   Angus Seller, PA-C  Multiple Vitamin (MULTIVITAMIN WITH MINERALS) TABS Take 1 tablet by mouth daily.    Historical Provider, MD  promethazine (PHENERGAN) 25 MG tablet Take 1 tablet (25 mg total) by mouth every 6 (six) hours as needed for nausea. 07/23/13   Phill Mutter Dammen, PA-C  traMADol (ULTRAM) 50 MG tablet Take 1 tablet (50 mg total) by mouth every 6 (six) hours as needed. 01/13/14   Graylon Good, PA-C   Triage Vitals: BP 133/82  Pulse 92  Temp(Src) 97.8 F (36.6 C) (Oral)  Resp 18  Ht 5' 11.5" (1.816 m)  Wt 198 lb (89.812 kg)  BMI 27.23 kg/m2  SpO2 98%  Physical Exam  Nursing note and vitals reviewed. Constitutional: He is oriented to person, place, and time. He appears well-developed and well-nourished.  HENT:  Head: Normocephalic and atraumatic.  Mouth/Throat: Oropharynx is clear and moist.  No oral lesions; airway patent  Eyes: Conjunctivae and EOM are normal. Pupils are equal, round, and reactive to light.  Neck: Normal range of  motion.  Cardiovascular: Normal rate, regular rhythm and normal heart sounds.   Pulmonary/Chest: Effort normal and breath sounds normal.  Abdominal: Soft. Bowel sounds are normal.  Musculoskeletal: Normal range of motion.  Neurological: He is alert and oriented to person, place, and time.  Skin: Skin is warm and dry. Rash noted. No petechiae noted. Rash is papular.  Multiple scattered pruritic papules across BUE, chest, waistline, and bilateral ankles; right lower medial calf with large area of excoriation and erythema concerning for early cellulitis; no streaking up or down leg; no abscess formation or drainage; no calf tenderness or  palpable cords; no vesicles or pustules noted; no lesions on palms or soles  Psychiatric: He has a normal mood and affect.    ED Course  Procedures (including critical care time) DIAGNOSTIC STUDIES: Oxygen Saturation is 98% on RA, normal by my interpretation.    Labs Review Labs Reviewed - No data to display  Imaging Review No results found.   EKG Interpretation None      MDM   Final diagnoses:  Rash and nonspecific skin eruption   43 y.o. M with intermittent rash over the past 3 weeks, this is third appearance.  Denies fever, chills, detergent changes, medication changes, plant or tick exposure.  On exam, patient has multiple scattered pruritic papules across all 4 extremities, chest, and waistline.  Appear to be consistent with chiggers.  No lesions on palms or soles.  Area on right lower leg is severely excoriated and erythematous-- i have concern for early cellulitis.  There is no visible abscess or significant swelling.  No clinical signs of DVT.  Patient current afebrile and non-toxic appearing.  No prior hx of MRSA.  Patient will be started on bactrim/keflex and permethrin cream.  Encouraged to wash all sheets, linens, and towels in hot water.  Repeat rinse in 1 week if no improvement.  Instructed to monitor right leg for signs of worsening infection-- increased redness, swelling, streaking of leg, fever, chills  Etc.  [facility].  Discussed plan with patient, he/she acknowledged understanding and agreed with plan of care.  Return precautions given for new or worsening symptoms.  I personally performed the services described in this documentation, which was scribed in my presence. The recorded information has been reviewed and is accurate.  Garlon HatchetLisa M Elisabet Gutzmer, PA-C 03/24/14 2253

## 2014-03-24 NOTE — Discharge Instructions (Signed)
Take the prescribed medication as directed. Monitor right leg for increased redness, swelling, streaking up leg/into foot, high fever, chills, sweats. Return to the ED for new or worsening symptoms.

## 2014-03-24 NOTE — ED Notes (Signed)
Pt. reports generalized body itchy rashes for several weeks , respirations unlabored / airway intact .

## 2014-03-25 NOTE — ED Provider Notes (Signed)
Medical screening examination/treatment/procedure(s) were performed by non-physician practitioner and as supervising physician I was immediately available for consultation/collaboration.   EKG Interpretation None        Samuel JesterKathleen Laiana Fratus, DO 03/25/14 1603

## 2014-05-22 ENCOUNTER — Emergency Department (HOSPITAL_COMMUNITY)
Admission: EM | Admit: 2014-05-22 | Discharge: 2014-05-22 | Disposition: A | Discharge status: Home / Self Care | Payer: 59 | Attending: Emergency Medicine | Admitting: Emergency Medicine

## 2014-05-22 ENCOUNTER — Encounter (HOSPITAL_COMMUNITY): Payer: Self-pay | Admitting: Emergency Medicine

## 2014-05-22 DIAGNOSIS — K006 Disturbances in tooth eruption: Secondary | ICD-10-CM | POA: Diagnosis not present

## 2014-05-22 DIAGNOSIS — Z792 Long term (current) use of antibiotics: Secondary | ICD-10-CM | POA: Diagnosis not present

## 2014-05-22 DIAGNOSIS — K088 Other specified disorders of teeth and supporting structures: Secondary | ICD-10-CM | POA: Insufficient documentation

## 2014-05-22 DIAGNOSIS — K029 Dental caries, unspecified: Secondary | ICD-10-CM | POA: Diagnosis not present

## 2014-05-22 DIAGNOSIS — K0889 Other specified disorders of teeth and supporting structures: Secondary | ICD-10-CM

## 2014-05-22 MED ORDER — AMOXICILLIN 500 MG PO CAPS: 500.0000 mg | ORAL_CAPSULE | Freq: Three times a day (TID) | ORAL | Status: AC

## 2014-05-22 MED ORDER — HYDROCODONE-ACETAMINOPHEN 5-325 MG PO TABS
1.0000 | ORAL_TABLET | Freq: Once | ORAL | Status: AC
  Administered 2014-05-22: 1 via ORAL

## 2014-05-22 MED ORDER — LIDOCAINE VISCOUS 2 % MT SOLN: 20.0000 mL | OROMUCOSAL | Status: AC | PRN

## 2014-05-22 MED ORDER — LIDOCAINE VISCOUS 2 % MT SOLN
15.0000 mL | Freq: Once | OROMUCOSAL | Status: AC
Start: 2014-05-22 — End: 2014-05-22
  Administered 2014-05-22: 15 mL via OROMUCOSAL

## 2014-05-22 MED ORDER — HYDROCODONE-ACETAMINOPHEN 5-325 MG PO TABS: 1.0000 | ORAL_TABLET | Freq: Four times a day (QID) | ORAL | Status: AC | PRN

## 2014-05-22 NOTE — ED Notes (Signed)
Pt. reports left lower molar pain radiating to left upper jaw onset 1 month ago.

## 2014-05-22 NOTE — ED Provider Notes (Signed)
CSN: [id number]     Arrival date & time 05/22/14  2257 History   First MD Initiated Contact with Patient 05/22/14 2322     Chief Complaint  Patient presents with  . Dental Pain     (Consider location/radiation/quality/duration/timing/severity/associated sxs/prior Treatment) HPI Comments: Patient is a 43 year old male presenting to the emergency department for left lower dental pain that began 3 weeks ago after chipping his tooth. He has pain radiates to his room here. Describes it as severe throbbing. Eating and drinking aggravate his pain. No modifying factors identified. [facility] with [facility] relief. Denies SOB, lip or tongue swelling, difficulty swallowing. 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   Patient is a 43 y.o. male presenting with tooth pain.  Dental Pain   Past Medical History  Diagnosis Date  . Ulcerative colitis    Past Surgical History  Procedure Laterality Date  . Cholecystectomy    . Appendectomy     Family History  Problem Relation Age of Onset  . Ulcerative colitis Mother   . Heart disease Father    History  Substance Use Topics  . Smoking status: Never Smoker   . Smokeless tobacco: Not on file  . Alcohol Use: No    Review of Systems  HENT: Positive for dental problem.   All other systems reviewed and are negative.     Allergies  Contrast media  Home Medications   Prior to Admission medications   Medication Sig Start Date End Date Taking? Authorizing Provider  amoxicillin (AMOXIL) 500 MG capsule Take 1 capsule (500 mg total) by mouth 3 (three) times daily. 05/22/14   Amir Glaus L Marquarius Lofton, PA-C  cephALEXin (KEFLEX) 500 MG capsule Take 1 capsule (500 mg total) by mouth 4 (four) times daily. 03/24/14   Garlon HatchetLisa M Sanders, PA-C  HYDROcodone-acetaminophen (NORCO/VICODIN) 5-325 MG per tablet Take 1-2 tablets by mouth every 6 (six) hours as needed for severe pain. 05/22/14   Niajah Sipos L Iden Stripling, PA-C  hydrOXYzine  (ATARAX/VISTARIL) 10 MG tablet Take 1 tablet (10 mg total) by mouth every 6 (six) hours as needed for itching. 03/24/14   Garlon HatchetLisa M Sanders, PA-C  lidocaine (XYLOCAINE) 2 % solution Use as directed 20 mLs in the mouth or throat as needed for mouth pain. 05/22/14   Sydnee Lamour L Anastacia Reinecke, PA-C  permethrin (ELIMITE) 5 % cream Apply to entire body other than face - let sit for 14 hours then wash off, may repeat in 1 week if still having symptoms 03/24/14   Garlon HatchetLisa M Sanders, PA-C  sulfamethoxazole-trimethoprim (SEPTRA DS) 800-160 MG per tablet Take 1 tablet by mouth every 12 (twelve) hours. 03/24/14   Garlon HatchetLisa M Sanders, PA-C   BP 142/84 mmHg  Pulse 72  Temp(Src) 98.2 F (36.8 C) (Oral)  Resp 18  Ht 6' (1.829 m)  Wt 197 lb (89.359 kg)  BMI 26.71 kg/m2  SpO2 99% Physical Exam  Constitutional: He is oriented to person, place, and time. He appears well-developed and well-nourished. No distress.  HENT:  Head: Normocephalic and atraumatic.  Right Ear: External ear normal.  Left Ear: External ear normal.  Nose: Nose normal.  Mouth/Throat: Uvula is midline, oropharynx is clear and moist and mucous membranes are normal. No trismus in the jaw. Abnormal dentition. Dental caries present. No dental abscesses or uvula swelling.    Submental and sublingual spaces are soft.  Eyes: Conjunctivae are normal.  Neck: Normal range of motion. Neck supple.  Cardiovascular: Normal rate.  Pulmonary/Chest: Effort normal.  Neurological: He is alert and oriented to person, place, and time.  Skin: Skin is warm and dry. He is not diaphoretic.  Psychiatric: He has a normal mood and affect.  Nursing note and vitals reviewed.   ED Course  Procedures (including critical care time) Medications  HYDROcodone-acetaminophen (NORCO/VICODIN) 5-325 MG per tablet 1 tablet (1 tablet Oral Given 05/22/14 2347)  lidocaine (XYLOCAINE) 2 % viscous mouth solution 15 mL (15 mLs Mouth/Throat Given 05/22/14 2352)    Labs Review Labs Reviewed -  No data to display  Imaging Review No results found.   EKG Interpretation None      MDM   Final diagnoses:  Pain, dental    Filed Vitals:   05/22/14 2355  BP: 142/84  Pulse: 72  Temp:   Resp:    Afebrile, NAD, non-toxic appearing, AAOx4.  Patient with toothache.  No gross abscess.  Exam unconcerning for Ludwig's angina or spread of infection.  Will treat with Amoxil and pain medicine.  Urged patient to follow-up with dentist.   Patient is stable at time of discharge     Jeannetta EllisJennifer L Robecca Fulgham, PA-C 05/23/14 0000  Vida RollerBrian D Miller, MD 05/23/14 [phone]

## 2014-05-22 NOTE — Discharge Instructions (Signed)
Please follow up with your primary care physician in 1-2 days. If you do not have one [facility]. Please take pain medication and/or muscle relaxants as prescribed and as needed for pain. Please do not drive on narcotic pain medication or on muscle relaxants. Please take your antibiotic until completion. Please read all discharge instructions and return precautions.    Dental Pain A tooth ache may be caused by cavities (tooth decay). Cavities expose the nerve of the tooth to air and hot or cold temperatures. It may come from an infection or abscess (also called a boil or furuncle) around your tooth. It is also often caused by dental caries (tooth decay). This causes the pain you are having. DIAGNOSIS  Your caregiver can diagnose this problem by exam. TREATMENT   If caused by an infection, it may be treated with medications which kill germs (antibiotics) and pain medications as prescribed by your caregiver. Take medications as directed.  Only take over-the-counter or prescription medicines for pain, discomfort, or fever as directed by your caregiver.  Whether the tooth ache today is caused by infection or dental disease, you should see your dentist as soon as possible for further care. SEEK MEDICAL CARE IF: The exam and treatment you received today has been provided on an emergency basis only. This is not a substitute for complete medical or dental care. If your problem worsens or new problems (symptoms) appear, and you are unable to meet with your dentist, call or return to this location. SEEK IMMEDIATE MEDICAL CARE IF:   You have a fever.  You develop redness and swelling of your face, jaw, or neck.  You are unable to open your mouth.  You have severe pain uncontrolled by pain medicine. MAKE SURE YOU:   Understand these instructions.  Will watch your condition.  Will get help right away if you are not doing well or get  worse. Document Released: 06/05/2005 Document Revised: 08/28/2011 Document Reviewed: 01/22/2008 [facility] Patient Information 2015 [place], [place]. This information is not intended to replace advice given to you by your health care provider. Make sure you discuss any questions you have with your health care provider.

## 2015-11-29 IMAGING — CT CT ABD-PELV W/O CM
2 of 4 series · 17 of 46 positions shown, 19 images · non-contrast
Comparison: None.

CLINICAL DATA: Right lower quadrant pain. Nausea, vomiting and
diarrhea.

EXAM:
CT ABDOMEN AND PELVIS WITHOUT CONTRAST
TECHNIQUE: Multidetector CT imaging of the abdomen and pelvis was performed
following the standard protocol without intravenous contrast.

[Series 2: abd/ pelvis 5.0 i30f 1 · axial · 0.86mm/px · z∈[+747,+1232]mm · 14 of 107 slices shown, 16 images]
[im 5/107  soft-tissue]
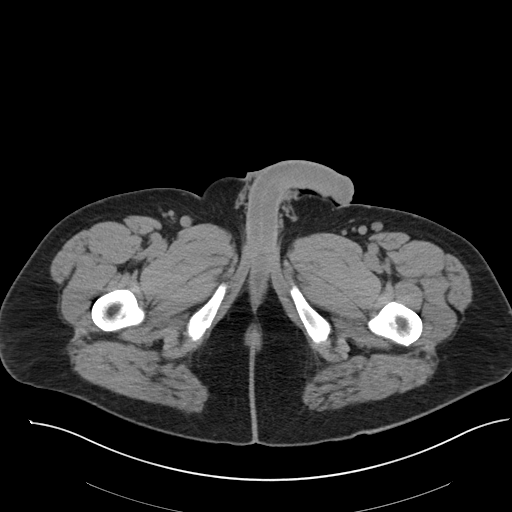
[im 5/107  bone]
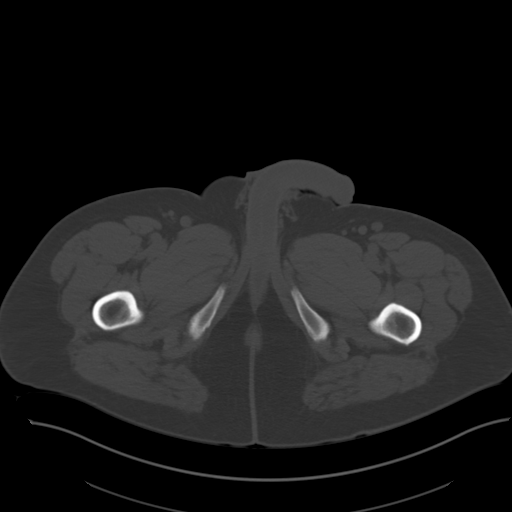
[im 13/107  soft-tissue]
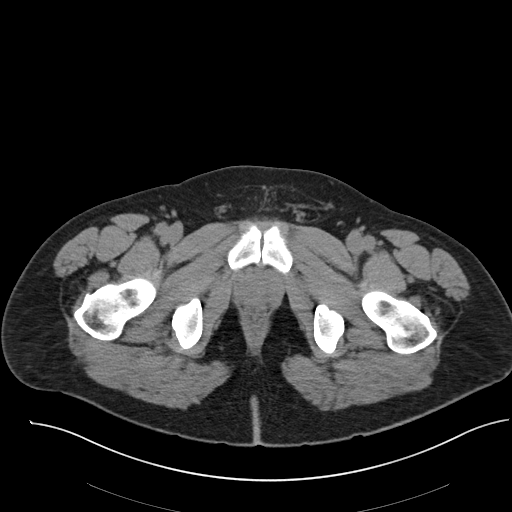
[im 22/107  soft-tissue]
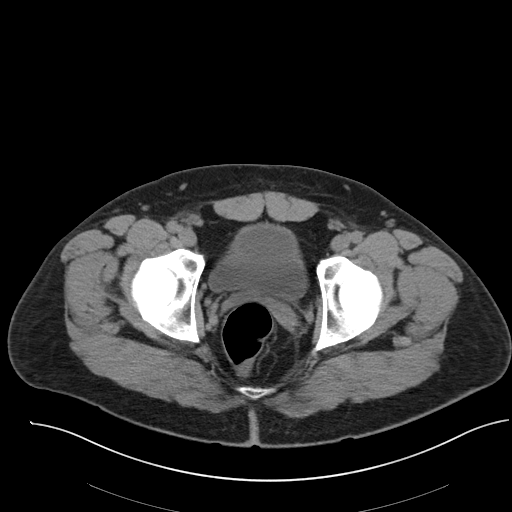
[im 30/107  soft-tissue]
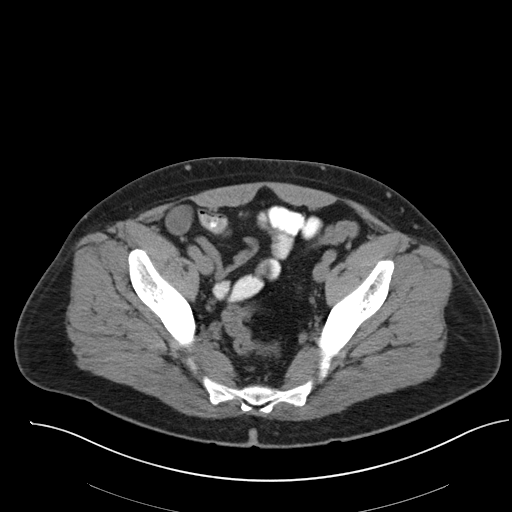
[im 34/107  soft-tissue]
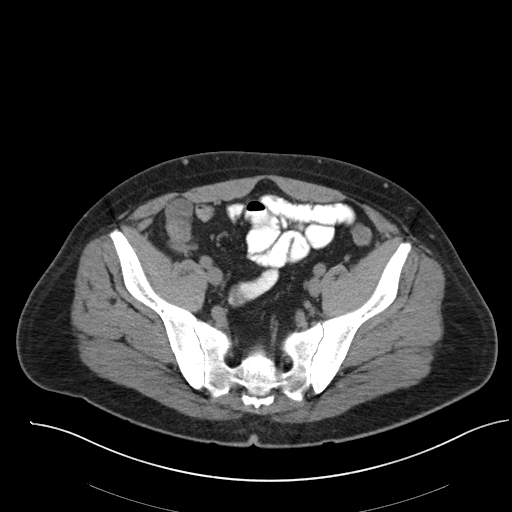
[im 43/107  soft-tissue]
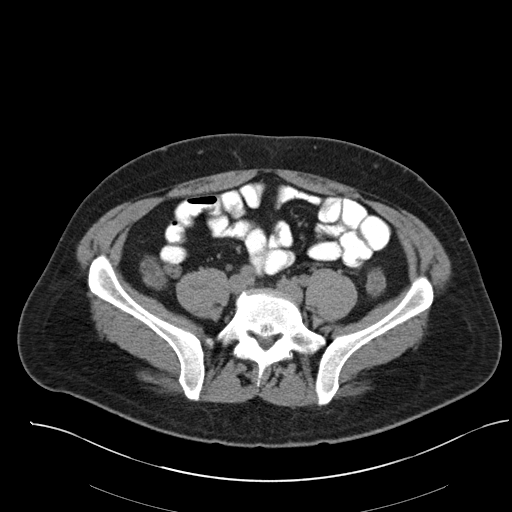
[im 51/107  soft-tissue]
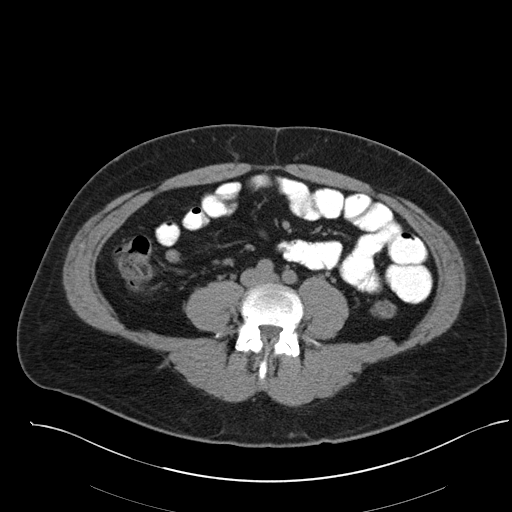
[im 56/107  soft-tissue]
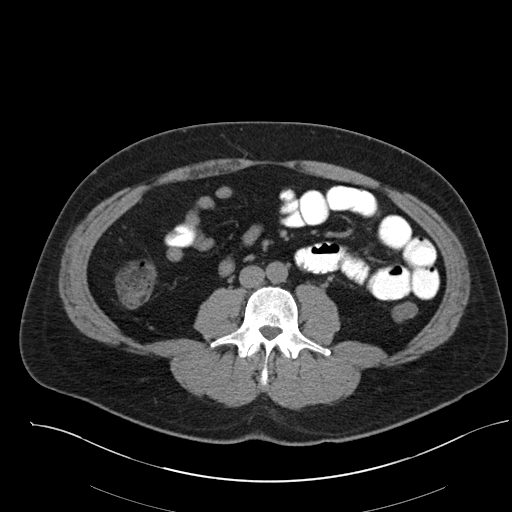
[im 64/107  soft-tissue]
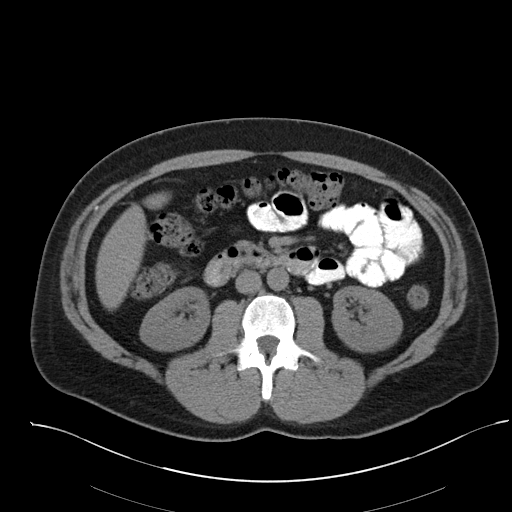
[im 64/107  bone]
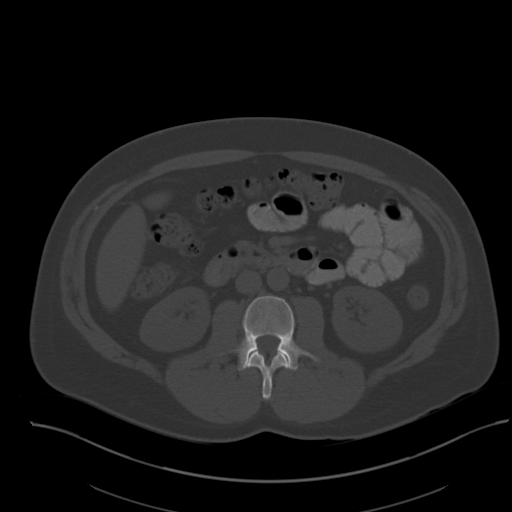
[im 73/107  soft-tissue]
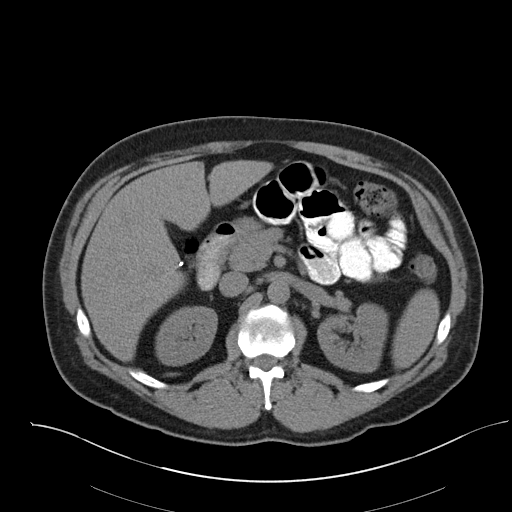
[im 81/107  soft-tissue]
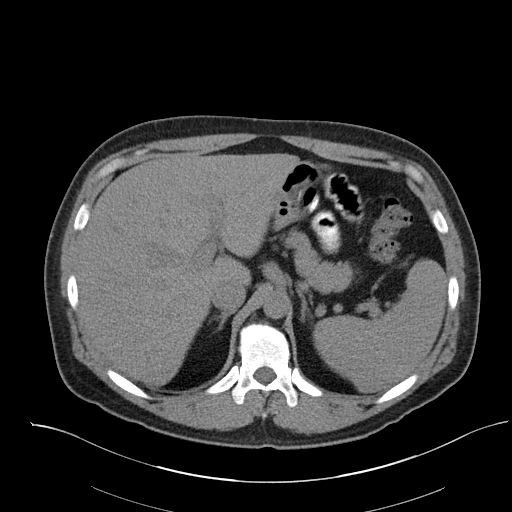
[im 85/107  soft-tissue]
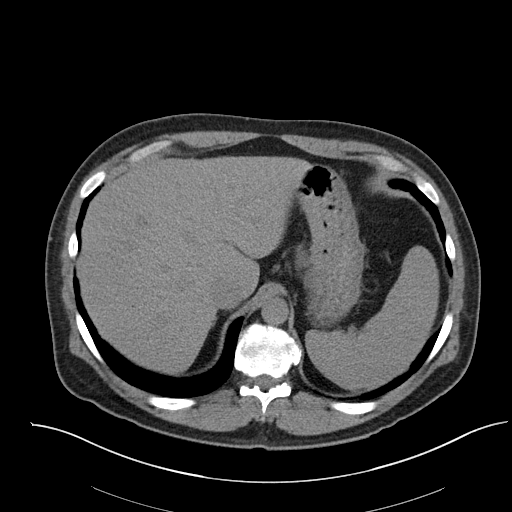
[im 94/107  soft-tissue]
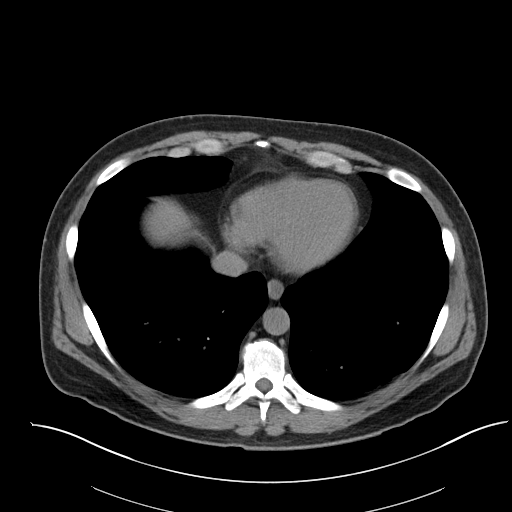
[im 102/107  soft-tissue]
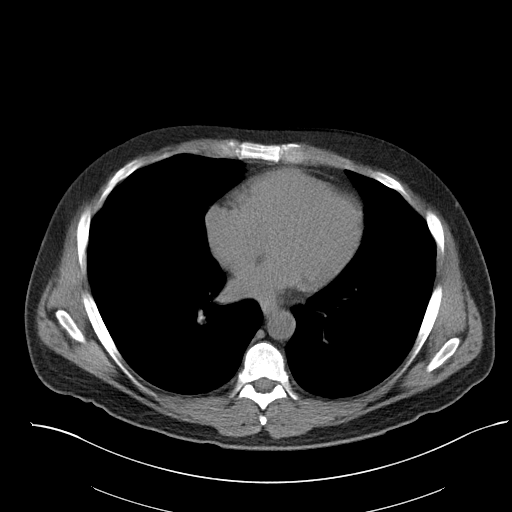

[Series 4: coronals · coronal · 0.84mm/px · 3 of 106 slices shown]
[im 36/106  soft-tissue]
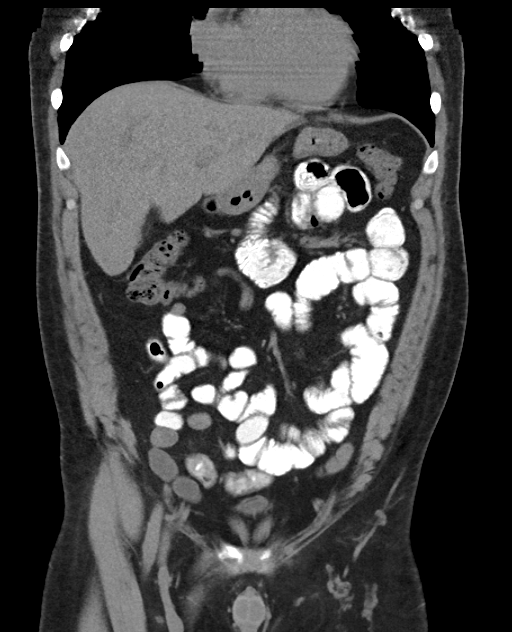
[im 47/106  soft-tissue]
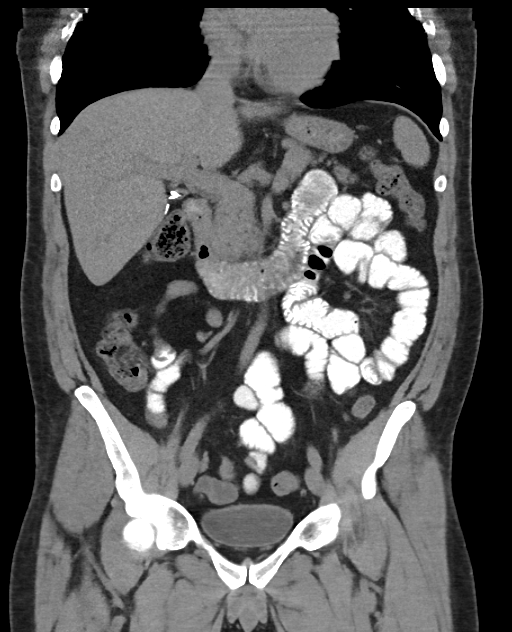
[im 59/106  soft-tissue]
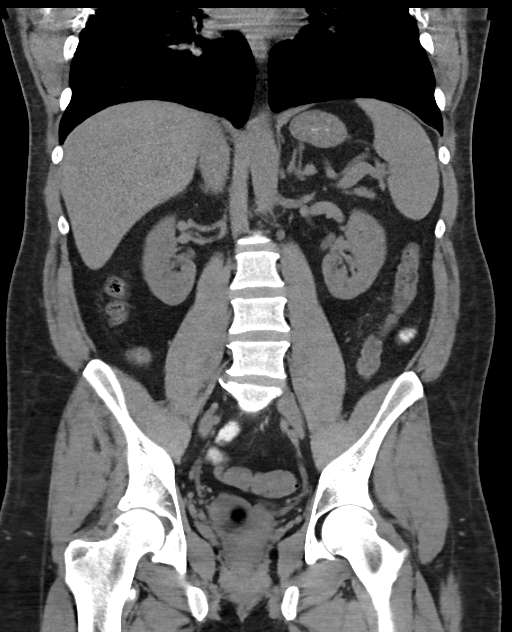

[17 of 46 positions shown; findings below may reference images not displayed]

FINDINGS: The lung bases are clear.  No pleural or pericardial effusion.

There are no renal or ureteral stones. Kidneys appear normal
bilaterally. The gallbladder and appendix have been removed. The
liver, spleen, adrenal glands, pancreas and biliary tree are
unremarkable. Urinary bladder, prostate gland and seminal vesicles
appear normal. Minimal sigmoid diverticulosis is noted. The stomach
and small bowel appear normal. There is no lymphadenopathy or fluid.
No focal bony abnormality.
IMPRESSION: Negative for urinary tract stone.  No acute finding.

Minimal sigmoid diverticulosis without diverticulitis.

## 2016-05-21 IMAGING — CR DG SHOULDER 2+V*R*
3 series · 3 of 3 positions shown · non-contrast
Comparison: None.

CLINICAL DATA: Pain.

EXAM:
RIGHT SHOULDER - 2+ VIEW

[view not recorded (1 of 3)]
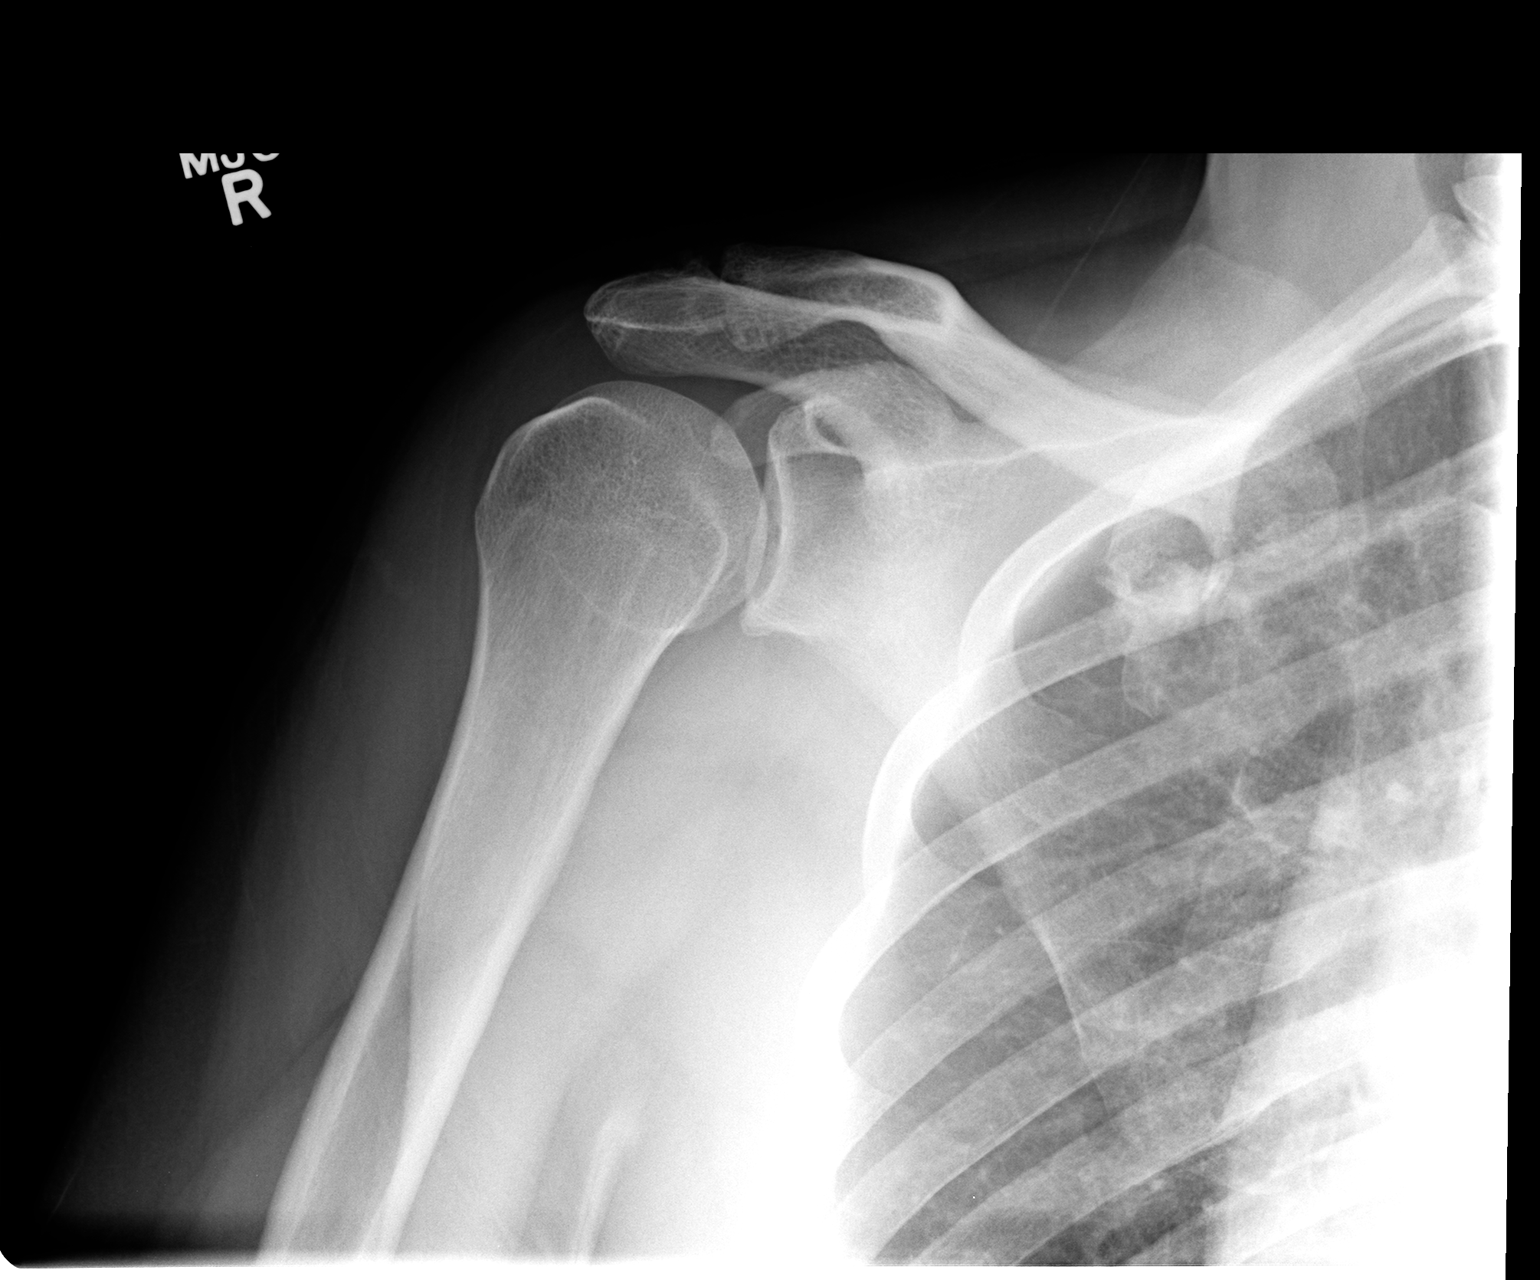

[view not recorded (2 of 3)]
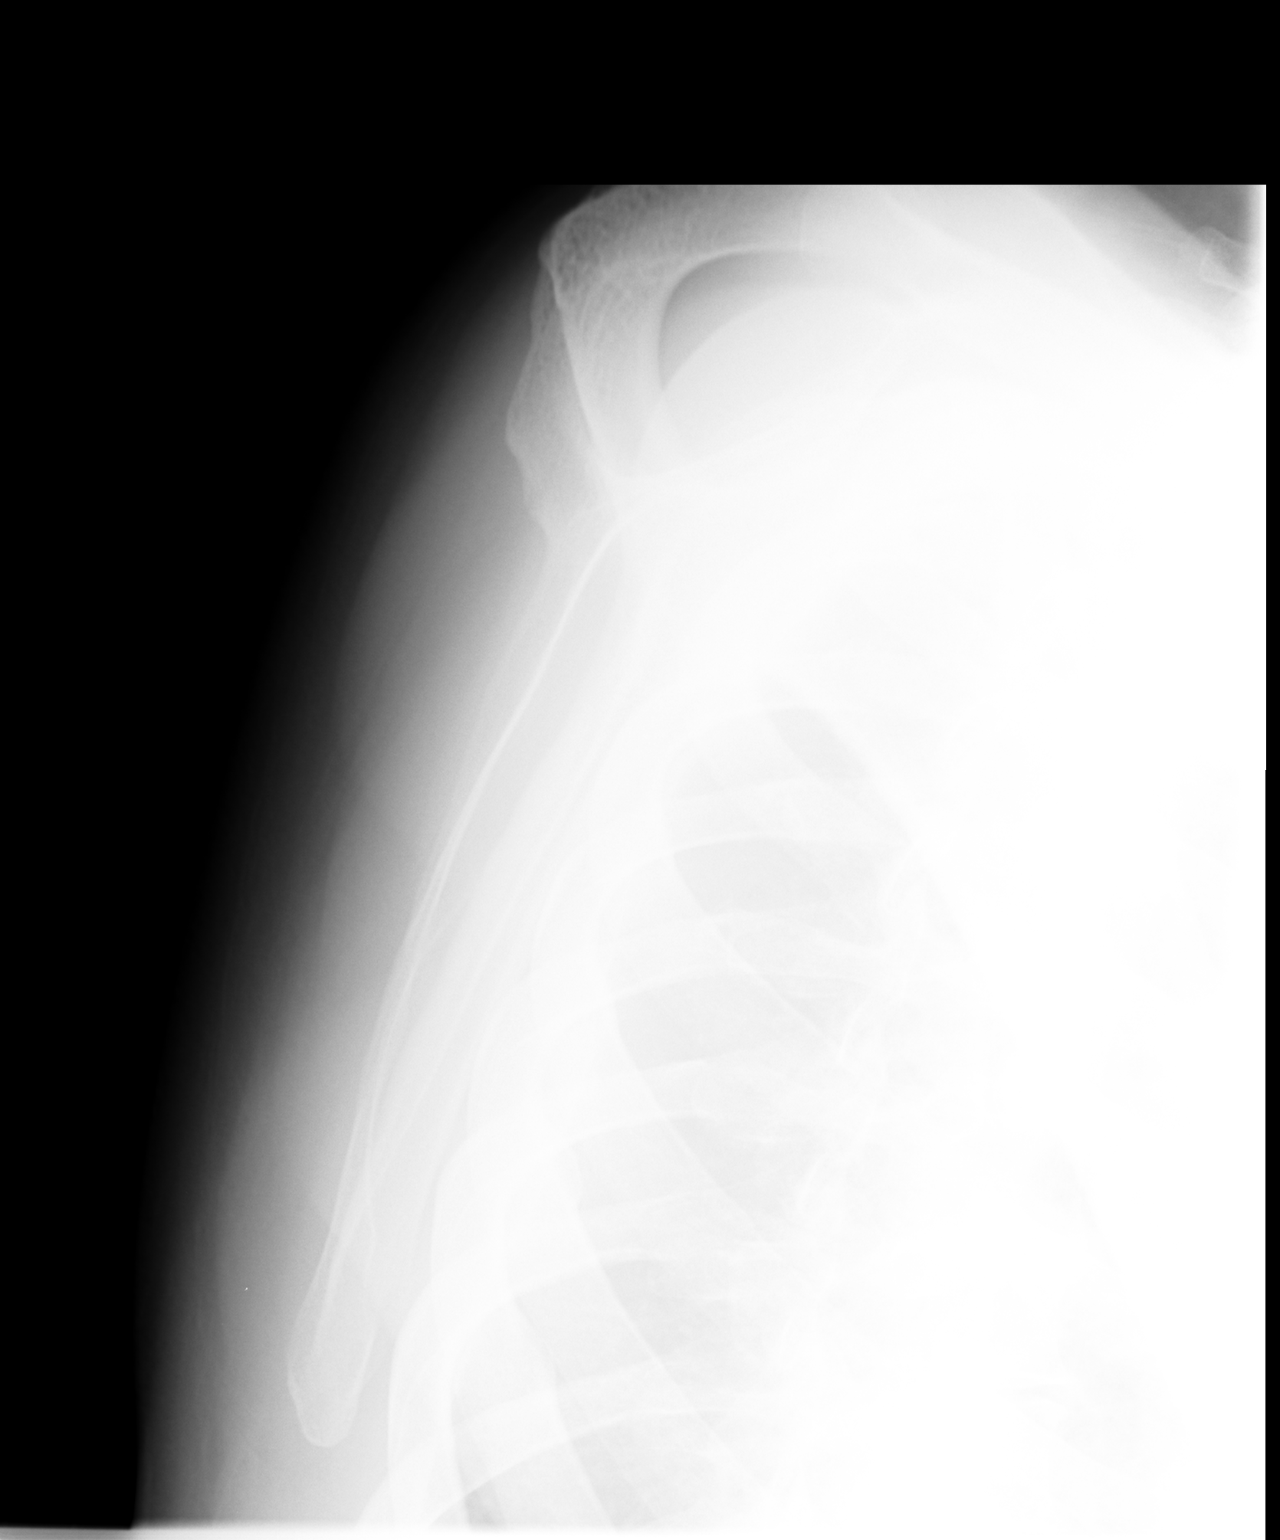

[view not recorded (3 of 3)]
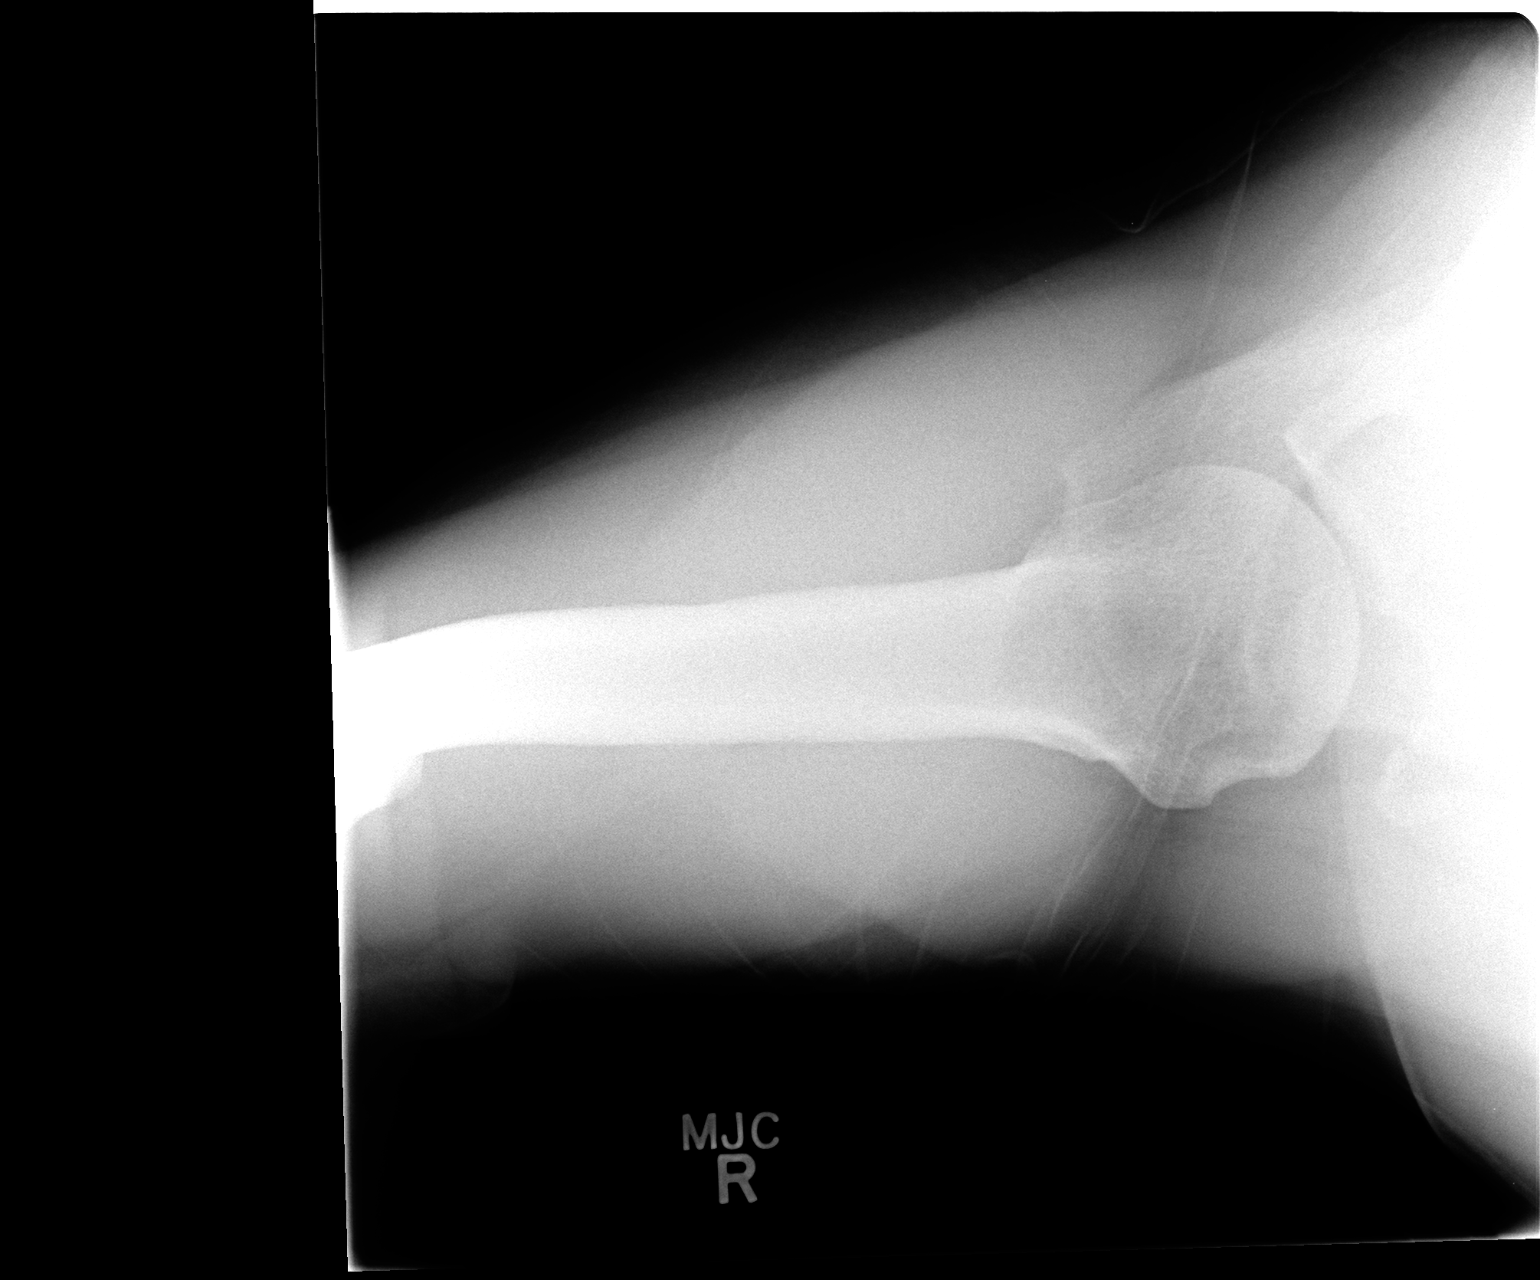

[3 of 3 positions shown; findings below may reference images not displayed]

FINDINGS: No acute bony or joint abnormality identified. No evidence of
fracture dislocation.
IMPRESSION: No acute abnormality.
